# Patient Record
Sex: Female | Born: 1950
Health system: Southern US, Community
[De-identification: ages and names within clinical notes are randomized; demographics above are authoritative.]

## PROBLEM LIST (undated history)

## (undated) DIAGNOSIS — Z98811 Dental restoration status: Secondary | ICD-10-CM

## (undated) DIAGNOSIS — G4733 Obstructive sleep apnea (adult) (pediatric): Secondary | ICD-10-CM

## (undated) DIAGNOSIS — S92001A Unspecified fracture of right calcaneus, initial encounter for closed fracture: Secondary | ICD-10-CM

## (undated) DIAGNOSIS — E119 Type 2 diabetes mellitus without complications: Secondary | ICD-10-CM

## (undated) DIAGNOSIS — M797 Fibromyalgia: Secondary | ICD-10-CM

## (undated) DIAGNOSIS — E785 Hyperlipidemia, unspecified: Secondary | ICD-10-CM

## (undated) DIAGNOSIS — F32A Depression, unspecified: Secondary | ICD-10-CM

## (undated) DIAGNOSIS — Z972 Presence of dental prosthetic device (complete) (partial): Secondary | ICD-10-CM

## (undated) DIAGNOSIS — R112 Nausea with vomiting, unspecified: Secondary | ICD-10-CM

## (undated) DIAGNOSIS — Z9889 Other specified postprocedural states: Secondary | ICD-10-CM

## (undated) DIAGNOSIS — I10 Essential (primary) hypertension: Secondary | ICD-10-CM

## (undated) DIAGNOSIS — S90829A Blister (nonthermal), unspecified foot, initial encounter: Secondary | ICD-10-CM

## (undated) DIAGNOSIS — R12 Heartburn: Secondary | ICD-10-CM

## (undated) DIAGNOSIS — Z8719 Personal history of other diseases of the digestive system: Secondary | ICD-10-CM

## (undated) DIAGNOSIS — F329 Major depressive disorder, single episode, unspecified: Secondary | ICD-10-CM

## (undated) DIAGNOSIS — G8929 Other chronic pain: Secondary | ICD-10-CM

## (undated) DIAGNOSIS — M549 Dorsalgia, unspecified: Secondary | ICD-10-CM

## (undated) HISTORY — DX: Hyperlipidemia, unspecified: E78.5

## (undated) HISTORY — PX: ABDOMINAL HYSTERECTOMY: SHX81

## (undated) HISTORY — DX: Major depressive disorder, single episode, unspecified: F32.9

## (undated) HISTORY — DX: Depression, unspecified: F32.A

## (undated) HISTORY — PX: TRACHEOSTOMY: SUR1362

## (undated) HISTORY — DX: Obstructive sleep apnea (adult) (pediatric): G47.33

## (undated) HISTORY — PX: FOOT SURGERY: SHX648

## (undated) HISTORY — PX: TYMPANOPLASTY: SHX33

## (undated) HISTORY — PX: APPENDECTOMY: SHX54

## (undated) HISTORY — DX: Essential (primary) hypertension: I10

## (undated) HISTORY — PX: BILATERAL OOPHORECTOMY: SHX1221

## (undated) HISTORY — DX: Type 2 diabetes mellitus without complications: E11.9

---

## 1998-02-11 ENCOUNTER — Ambulatory Visit (HOSPITAL_COMMUNITY): Admission: RE | Admit: 1998-02-11 | Discharge: 1998-02-11 | Payer: Self-pay | Admitting: Neurology

## 1998-07-14 ENCOUNTER — Encounter: Payer: Self-pay | Admitting: Neurosurgery

## 1998-07-18 ENCOUNTER — Inpatient Hospital Stay (HOSPITAL_COMMUNITY): Admission: RE | Admit: 1998-07-18 | Discharge: 1998-07-20 | Payer: Self-pay | Admitting: Neurosurgery

## 1998-07-18 ENCOUNTER — Encounter: Payer: Self-pay | Admitting: Neurosurgery

## 1998-07-18 HISTORY — PX: ANTERIOR CERVICAL DISCECTOMY: SHX1160

## 1999-04-13 ENCOUNTER — Encounter: Admission: RE | Admit: 1999-04-13 | Discharge: 1999-04-13 | Payer: Self-pay | Admitting: Neurosurgery

## 2001-04-21 ENCOUNTER — Encounter: Admission: RE | Admit: 2001-04-21 | Discharge: 2001-04-21 | Payer: Self-pay | Admitting: Neurosurgery

## 2001-04-21 ENCOUNTER — Encounter: Payer: Self-pay | Admitting: Neurosurgery

## 2004-03-15 ENCOUNTER — Encounter: Admission: RE | Admit: 2004-03-15 | Discharge: 2004-03-15 | Payer: Self-pay | Admitting: Neurosurgery

## 2004-03-27 ENCOUNTER — Encounter: Admission: RE | Admit: 2004-03-27 | Discharge: 2004-03-27 | Payer: Self-pay | Admitting: Neurosurgery

## 2004-06-05 ENCOUNTER — Encounter: Admission: RE | Admit: 2004-06-05 | Discharge: 2004-06-05 | Payer: Self-pay | Admitting: Neurosurgery

## 2004-06-22 ENCOUNTER — Encounter: Admission: RE | Admit: 2004-06-22 | Discharge: 2004-06-22 | Payer: Self-pay | Admitting: Neurosurgery

## 2004-07-06 ENCOUNTER — Encounter: Admission: RE | Admit: 2004-07-06 | Discharge: 2004-07-06 | Payer: Self-pay | Admitting: Neurosurgery

## 2004-08-01 ENCOUNTER — Encounter: Admission: RE | Admit: 2004-08-01 | Discharge: 2004-08-01 | Payer: Self-pay | Admitting: Neurosurgery

## 2004-10-04 ENCOUNTER — Inpatient Hospital Stay (HOSPITAL_COMMUNITY): Admission: RE | Admit: 2004-10-04 | Discharge: 2004-10-06 | Payer: Self-pay | Admitting: Neurosurgery

## 2004-10-04 HISTORY — PX: POSTERIOR LUMBAR FUSION: SHX6036

## 2005-01-24 ENCOUNTER — Emergency Department (HOSPITAL_COMMUNITY): Admission: EM | Admit: 2005-01-24 | Discharge: 2005-01-24 | Payer: Self-pay | Admitting: Emergency Medicine

## 2005-01-26 ENCOUNTER — Encounter (HOSPITAL_COMMUNITY): Admission: RE | Admit: 2005-01-26 | Discharge: 2005-02-25 | Payer: Self-pay | Admitting: Emergency Medicine

## 2005-01-27 ENCOUNTER — Ambulatory Visit (HOSPITAL_COMMUNITY): Payer: Self-pay | Admitting: Emergency Medicine

## 2006-07-29 ENCOUNTER — Encounter: Admission: RE | Admit: 2006-07-29 | Discharge: 2006-07-29 | Payer: Self-pay | Admitting: Internal Medicine

## 2007-07-30 ENCOUNTER — Encounter: Admission: RE | Admit: 2007-07-30 | Discharge: 2007-07-30 | Payer: Self-pay | Admitting: Internal Medicine

## 2007-11-07 ENCOUNTER — Ambulatory Visit (HOSPITAL_COMMUNITY): Admission: RE | Admit: 2007-11-07 | Discharge: 2007-11-07 | Payer: Self-pay | Admitting: Family Medicine

## 2008-04-19 ENCOUNTER — Ambulatory Visit (HOSPITAL_COMMUNITY): Admission: RE | Admit: 2008-04-19 | Discharge: 2008-04-19 | Payer: Self-pay | Admitting: Urology

## 2009-10-03 ENCOUNTER — Encounter: Payer: Self-pay | Admitting: Cardiology

## 2009-10-06 ENCOUNTER — Encounter: Payer: Self-pay | Admitting: Cardiology

## 2010-02-23 ENCOUNTER — Encounter (INDEPENDENT_AMBULATORY_CARE_PROVIDER_SITE_OTHER): Payer: Self-pay | Admitting: *Deleted

## 2010-02-23 ENCOUNTER — Observation Stay (HOSPITAL_COMMUNITY): Admission: EM | Admit: 2010-02-23 | Discharge: 2010-02-24 | Payer: Self-pay | Admitting: Emergency Medicine

## 2010-02-23 ENCOUNTER — Ambulatory Visit: Payer: Self-pay | Admitting: Cardiology

## 2010-02-24 ENCOUNTER — Encounter (INDEPENDENT_AMBULATORY_CARE_PROVIDER_SITE_OTHER): Payer: Self-pay | Admitting: Internal Medicine

## 2010-02-24 ENCOUNTER — Encounter (INDEPENDENT_AMBULATORY_CARE_PROVIDER_SITE_OTHER): Payer: Self-pay | Admitting: *Deleted

## 2010-03-10 ENCOUNTER — Encounter: Payer: Self-pay | Admitting: Cardiology

## 2010-04-03 ENCOUNTER — Encounter: Payer: Self-pay | Admitting: Cardiology

## 2010-04-03 DIAGNOSIS — E785 Hyperlipidemia, unspecified: Secondary | ICD-10-CM

## 2010-04-03 DIAGNOSIS — I10 Essential (primary) hypertension: Secondary | ICD-10-CM | POA: Insufficient documentation

## 2010-04-03 DIAGNOSIS — I498 Other specified cardiac arrhythmias: Secondary | ICD-10-CM | POA: Insufficient documentation

## 2010-04-04 ENCOUNTER — Ambulatory Visit: Payer: Self-pay | Admitting: Cardiology

## 2010-04-04 ENCOUNTER — Encounter (INDEPENDENT_AMBULATORY_CARE_PROVIDER_SITE_OTHER): Payer: Self-pay | Admitting: *Deleted

## 2010-04-04 DIAGNOSIS — R079 Chest pain, unspecified: Secondary | ICD-10-CM

## 2010-04-04 DIAGNOSIS — R0602 Shortness of breath: Secondary | ICD-10-CM

## 2010-04-25 ENCOUNTER — Ambulatory Visit: Payer: Self-pay | Admitting: Cardiology

## 2010-04-25 ENCOUNTER — Encounter: Payer: Self-pay | Admitting: Cardiology

## 2010-04-30 ENCOUNTER — Encounter: Payer: Self-pay | Admitting: Cardiology

## 2010-05-01 ENCOUNTER — Ambulatory Visit: Payer: Self-pay | Admitting: Cardiology

## 2010-07-22 ENCOUNTER — Encounter: Payer: Self-pay | Admitting: Family Medicine

## 2010-07-23 ENCOUNTER — Encounter: Payer: Self-pay | Admitting: Family Medicine

## 2010-07-23 ENCOUNTER — Encounter: Payer: Self-pay | Admitting: Neurosurgery

## 2010-08-01 NOTE — Letter (Signed)
Summary: [facility] D/C DR. Crista Curb  [place] D/C DR. Crista Curb   Imported By: Zachary George 04/03/2010 09:23:08  _____________________________________________________________________  External Attachment:    Type:   Image     Comment:   External Document

## 2010-08-01 NOTE — Miscellaneous (Signed)
  Clinical Lists Changes  Observations: Added new observation of PAST MED HX: Hypertension Depression Dyslipidemia Bradycardia....assymptomatic...hospital..Marland Kitchen8/2011 GI bleed in the past EF  60-65%...echo...02/24/2010 Chest pain and SOB   04/04/2010    nuclear, 04/25/2010..normal...EF 70% (04/30/2010 15:56) Added new observation of PRIMARY MD: Hasanaj (04/30/2010 15:56)       Past History:  Past Medical History: Hypertension Depression Dyslipidemia Bradycardia....assymptomatic...hospital..Marland Kitchen8/2011 GI bleed in the past EF  60-65%...echo...02/24/2010 Chest pain and SOB   04/04/2010    nuclear, 04/25/2010..normal...EF 70%

## 2010-08-01 NOTE — Assessment & Plan Note (Signed)
Summary: 1 MO F/U PER 10/4 OV-JM   Visit Type:  Follow-up Primary Cassandra Mccullough:  Hasanaj  CC:  chest pain.  History of Present Illness: patient is seen for cardiology followup.  I saw her last April 04, 2010.  She had been hospitalized with chest pain and had no MI.  She was discharged home and I saw her in the office.  Decision was made to proceed with stress Myoview.  This was done with pharmacologic stress.  Study was normal.  Ejection fraction was normal and there was no ischemia.  She is now seen for followup..  Preventive Screening-Counseling & Management  Alcohol-Tobacco     Smoking Status: quit     Year Quit: 2002  Current Medications (verified): 1)  Hyzaar 100-25 Mg Tabs (Losartan Potassium-Hctz) .... Take 1 Tablet By Mouth Once A Day 2)  Fluoxetine Hcl 40 Mg Caps (Fluoxetine Hcl) .... Take 1 Tablet By Mouth Once A Day 3)  Nitrostat 0.4 Mg Subl (Nitroglycerin) .... Use As Directed Chest Pain 4)  Amlodipine Besylate 10 Mg Tabs (Amlodipine Besylate) .... Take 1 Tablet By Mouth Once A Day 5)  Aspir-Low 81 Mg Tbec (Aspirin) .... Take 1 Tablet By Mouth Once A Day 6)  Multivitamins  Tabs (Multiple Vitamin) .... Take 1 Tablet By Mouth Once A Day 7)  Flaxseed Oil 1000 Mg Caps (Flaxseed (Linseed)) .... Take 1 Tablet By Mouth Once A Day 8)  Vitamin B-12 1000 Mcg Tabs (Cyanocobalamin) .... Take 1 Tablet By Mouth Once A Day  Allergies (verified): 1)  ! Nsaids 2)  ! Asa  Comments:  Nurse/Medical Assistant: The patient's medications and allergies were verbally reviewed with the patient and were updated in the Medication and Allergy Lists.  Past History:  Past Medical History: Last updated: 04/30/2010 Hypertension Depression Dyslipidemia Bradycardia....assymptomatic...hospital..Marland Kitchen8/2011 GI bleed in the past EF  60-65%...echo...02/24/2010 Chest pain and SOB   04/04/2010    nuclear, 04/25/2010..normal...EF 70%  Review of Systems       Patient denies fever, chills, headache,  sweats, rash, change in vision, change in hearing, chest pain, cough, nausea vomiting, urinary symptoms.  All the systems are reviewed and are negative.  Vital Signs:  Patient profile:   60 year old female Height:      72 inches Weight:      184 pounds Pulse rate:   66 / minute BP sitting:   119 / 76  (left arm) Cuff size:   regular  Vitals Entered By: Carlye Grippe (May 01, 2010 1:54 PM)  Physical Exam  General:  patient is stable. Eyes:  no xanthelasma. Neck:  no jugular venous distention. Lungs:  lungs are clear.  Respiratory effort is nonlabored. Heart:  cardiac exam reveals S1-S2.  No clicks or significant murmurs Abdomen:  abdomen is soft. Extremities:  no peripheral edema. Psych:  patient is oriented to person time and place.  Affect is normal.   Impression & Recommendations:  Problem # 1:  CHEST PAIN (ICD-786.50)  The following medications were removed from the medication list:    Isosorbide Mononitrate Cr 30 Mg Xr24h-tab (Isosorbide mononitrate) .Marland Kitchen... Take 1 tablet by mouth once a day Her updated medication list for this problem includes:    Nitrostat 0.4 Mg Subl (Nitroglycerin) ..... Use as directed chest pain    Amlodipine Besylate 10 Mg Tabs (Amlodipine besylate) .Marland Kitchen... Take 1 tablet by mouth once a day    Aspir-low 81 Mg Tbec (Aspirin) .Marland Kitchen... Take 1 tablet by mouth once a day There is  no evidence of ischemic disease.  No further workup.  Problem # 2:  SHORTNESS OF BREATH (ICD-786.05)  Her updated medication list for this problem includes:    Hyzaar 100-25 Mg Tabs (Losartan potassium-hctz) .Marland Kitchen... Take 1 tablet by mouth once a day    Amlodipine Besylate 10 Mg Tabs (Amlodipine besylate) .Marland Kitchen... Take 1 tablet by mouth once a day    Aspir-low 81 Mg Tbec (Aspirin) .Marland Kitchen... Take 1 tablet by mouth once a day No further workup of shortness of breath as needed.  Problem # 3:  BRADYCARDIA (ICD-427.89)  The following medications were removed from the medication list:     Isosorbide Mononitrate Cr 30 Mg Xr24h-tab (Isosorbide mononitrate) .Marland Kitchen... Take 1 tablet by mouth once a day Her updated medication list for this problem includes:    Nitrostat 0.4 Mg Subl (Nitroglycerin) ..... Use as directed chest pain    Amlodipine Besylate 10 Mg Tabs (Amlodipine besylate) .Marland Kitchen... Take 1 tablet by mouth once a day    Aspir-low 81 Mg Tbec (Aspirin) .Marland Kitchen... Take 1 tablet by mouth once a day Bradycardias not a significant problem.  No further workup.  Patient should follow up with her primary physician for primary prevention  Patient Instructions: 1)  No further cardiac work up.

## 2010-08-01 NOTE — Assessment & Plan Note (Signed)
Summary: np-chest tightness chest pressure   Visit Type:  Initial Consult Primary Provider:  Hasanaj  CC:  chest pain.  History of Present Illness: The patient is seen in consultation for the evaluation of chest pain and shortness of breath.  She was hospitalized at [facility] on February 23, 2010.  I have reviewed the history physical and the discharge summary.  I have also reviewed the echo report.  She was having right-sided chest pain and was admitted to the hospital.  She also had some asymptomatic bradycardia.  The echo revealed ejection fraction of 65%.  Her blood pressure was elevated.  She ruled out for an MI.  Yesterday she fell and injured her left wrist.  She is having significant pain today.  She did not have syncope.  She will be following up to have her left arm evaluated as soon as she leaves the office today.  She complains of shortness of breath.  She's not having any chest pain today.  Her risk factors include hypertension and dyslipidemia.  She does not smoke. She does have a family history of coronary disease.  Preventive Screening-Counseling & Management  Alcohol-Tobacco     Smoking Status: quit     Year Quit: 2002  Current Medications (verified): 1)  Hyzaar 100-25 Mg Tabs (Losartan Potassium-Hctz) .... Take 1 Tablet By Mouth Once A Day 2)  Fluoxetine Hcl 40 Mg Caps (Fluoxetine Hcl) .... Take 1 Tablet By Mouth Once A Day 3)  Nitrostat 0.4 Mg Subl (Nitroglycerin) .... Use As Directed Chest Pain 4)  Isosorbide Mononitrate Cr 30 Mg Xr24h-Tab (Isosorbide Mononitrate) .... Take 1 Tablet By Mouth Once A Day 5)  Amlodipine Besylate 5 Mg Tabs (Amlodipine Besylate) .... Take 1 Tablet By Mouth Once A Day 6)  Aspir-Low 81 Mg Tbec (Aspirin) .... Take 1 Tablet By Mouth Once A Day  Allergies (verified): 1)  ! Nsaids 2)  ! Asa  Comments:  Nurse/Medical Assistant: The patient's medication bottles and allergies were reviewed with the patient and were updated in the  Medication and Allergy Lists.  Past History:  Past Medical History: Last updated: 04/03/2010 Hypertension Depression Dyslipidemia Bradycardia....assymptomatic...hospital..Marland Kitchen8/2011 GI bleed in the past EF  60-65%...echo...02/24/2010  Family History: There is a family history of coronary disease.  She has 2 brothers who had heart surgery in their 57s.  Social History: the patient does not smoke.Smoking Status:  quit  Review of Systems       Patient denies fever, chills, headache, sweats, rash, change in vision, change in hearing, cough, nausea vomiting, urinary symptoms.  All other systems are reviewed and are negative.  Vital Signs:  Patient profile:   60 year old female Height:      72 inches Weight:      182 pounds BMI:     24.77 Pulse rate:   83 / minute BP sitting:   137 / 81  (right arm) Cuff size:   regular  Vitals Entered By: Carlye Grippe (April 04, 2010 9:13 AM) CC: chest pain   Physical Exam  General:  The patient is uncomfortable with pain in her left wrist.  Head:  head is atraumatic. Eyes:  no xanthelasma. Neck:  no jugular venous distention. Chest Wall:  no chest wall tenderness. Lungs:  lungs are clear.  Respiratory effort is nonlabored. Heart:  cardiac exam reveals S1-S2.  No clicks or significant murmurs. Abdomen:  the abdomen is soft. Msk:  there is swelling in her left wrist. Extremities:  there is  no significant peripheral edema. Skin:  no skin rashes. Psych:  patient is oriented to person time and place.  She is bothered by the pain in her left wrist.   Impression & Recommendations:  Problem # 1:  CHEST PAIN (ICD-786.50)  The following medications were removed from the medication list:    Labetalol Hcl 300 Mg Tabs (Labetalol hcl) .Marland Kitchen... Take 1 tablet by mouth two times a day Her updated medication list for this problem includes:    Nitrostat 0.4 Mg Subl (Nitroglycerin) ..... Use as directed chest pain    Isosorbide Mononitrate Cr 30 Mg  Xr24h-tab (Isosorbide mononitrate) .Marland Kitchen... Take 1 tablet by mouth once a day    Amlodipine Besylate 5 Mg Tabs (Amlodipine besylate) .Marland Kitchen... Take 1 tablet by mouth once a day    Aspir-low 81 Mg Tbec (Aspirin) .Marland Kitchen... Take 1 tablet by mouth once a day Patient had some chest pain and took her into the hospital.  She did not have an MI.  She does have risk factors.  She had normal left ventricular function by echo.  We will plan a Myoview scan and I will see her in followup.  Problem # 2:  SHORTNESS OF BREATH (ICD-786.05)  The following medications were removed from the medication list:    Labetalol Hcl 300 Mg Tabs (Labetalol hcl) .Marland Kitchen... Take 1 tablet by mouth two times a day Her updated medication list for this problem includes:    Hyzaar 100-25 Mg Tabs (Losartan potassium-hctz) .Marland Kitchen... Take 1 tablet by mouth once a day    Amlodipine Besylate 5 Mg Tabs (Amlodipine besylate) .Marland Kitchen... Take 1 tablet by mouth once a day    Aspir-low 81 Mg Tbec (Aspirin) .Marland Kitchen... Take 1 tablet by mouth once a day  Orders: Nuclear Med (Nuc Med) This point the etiology of her shortness of breath is not clear.  I will assess this further after we have the Myoview results area  Problem # 3:  * LEFT WRIST INJURY The patient will seek further medical care after she leaves our office today.  She has a definite injury to her left wrist.  Problem # 4:  BRADYCARDIA (ICD-427.89)  The following medications were removed from the medication list:    Labetalol Hcl 300 Mg Tabs (Labetalol hcl) .Marland Kitchen... Take 1 tablet by mouth two times a day Her updated medication list for this problem includes:    Nitrostat 0.4 Mg Subl (Nitroglycerin) ..... Use as directed chest pain    Isosorbide Mononitrate Cr 30 Mg Xr24h-tab (Isosorbide mononitrate) .Marland Kitchen... Take 1 tablet by mouth once a day    Amlodipine Besylate 5 Mg Tabs (Amlodipine besylate) .Marland Kitchen... Take 1 tablet by mouth once a day    Aspir-low 81 Mg Tbec (Aspirin) .Marland Kitchen... Take 1 tablet by mouth once a day The  patient had bradycardia in the hospital.  It was not symptomatic.  Heart rate today is 83.  No further workup at this time.  Problem # 5:  DYSLIPIDEMIA (ICD-272.4) There is question of dyslipidemia in the past.  She is not on any medications.  She will need followup of her lipid status.  Problem # 6:  * DEPRESSION The patient is on medication for this issue area  Problem # 7:  HYPERTENSION (ICD-401.9)  The following medications were removed from the medication list:    Labetalol Hcl 300 Mg Tabs (Labetalol hcl) .Marland Kitchen... Take 1 tablet by mouth two times a day Her updated medication list for this problem includes:    Hyzaar  100-25 Mg Tabs (Losartan potassium-hctz) .Marland Kitchen... Take 1 tablet by mouth once a day    Amlodipine Besylate 5 Mg Tabs (Amlodipine besylate) .Marland Kitchen... Take 1 tablet by mouth once a day    Aspir-low 81 Mg Tbec (Aspirin) .Marland Kitchen... Take 1 tablet by mouth once a day Blood pressure is treated today.  No change in therapy.  Patient Instructions: 1)  Your physician has requested that you have an Best boy.  For further information please visit [email or web address]  Please follow instruction sheet, as given. 2)  Follow up with Dr. Myrtis Ser on Monday, May 01, 2010 at 2pm. 3)  Your physician recommends that you continue on your current medications as directed. Please refer to the Current Medication list given to you today.

## 2010-08-01 NOTE — Letter (Signed)
Summary: Lexiscan or Dobutamine Pharmacist, community at [facility]  [address]   [place], [place] [address]   Phone: [phone]  Fax: [phone]      [facility]  Lexiscan or Dobutamine Cardiolite Strss Test    Cassandra Mccullough  Appointment Date:_  Appointment Time:_  Your doctor has ordered a CARDIOLITE STRESS TEST using a medication to stimulate exercise so that you will not have to walk on the treadmill to determine the condition of your heart during stress. If you take blood pressure medication, ask your doctor if you should take it the day of your test. You should not have anything to eat or drink at least 4 hours before your test is scheduled, and no caffeine, including decaffeinated tea and coffee, chocolate, and soft drinks for 24 hours before your test.  You will need to register at the Outpatient/Main Entrance at the hospital 15 minutes before your appointment time. It is a good idea to bring a copy of your order with you. They will direct you to the Diagnostic Imaging (Radiology) Department.  You will be asked to undress from the waist up and given a hospital gown to wear, so dress comfortably from the waist down for example: Sweat pants, shorts, or skirt Rubber soled lace up shoes (tennis shoes)  Plan on about three hours from registration to release from the hospital  You may take all of your medications with a sip of water on the morning of your test.

## 2010-08-01 NOTE — Letter (Signed)
Summary: External Correspondence/ OFFICE VISIT DR. HASANAJ  External Correspondence/ OFFICE VISIT DR. HASANAJ   Imported By: Dorise Hiss 04/03/2010 15:38:26  _____________________________________________________________________  External Attachment:    Type:   Image     Comment:   External Document

## 2010-08-01 NOTE — Miscellaneous (Signed)
  Clinical Lists Changes  Problems: Added new problem of HYPERTENSION (ICD-401.9) Added new problem of * DEPRESSION Added new problem of DYSLIPIDEMIA (ICD-272.4) Added new problem of BRADYCARDIA (ICD-427.89) Observations: Added new observation of PAST MED HX: Hypertension Depression Dyslipidemia Bradycardia....assymptomatic...hospital..Marland Kitchen8/2011 GI bleed in the past EF  60-65%...echo...02/24/2010 (04/03/2010 10:01)       Past History:  Past Medical History: Hypertension Depression Dyslipidemia Bradycardia....assymptomatic...hospital..Marland Kitchen8/2011 GI bleed in the past EF  60-65%...echo...02/24/2010

## 2010-09-15 LAB — POCT CARDIAC MARKERS
CKMB, poc: 1.1 ng/mL (ref 1.0–8.0)
Myoglobin, poc: 42.9 ng/mL (ref 12–200)
Myoglobin, poc: 57.1 ng/mL (ref 12–200)
Troponin i, poc: <0.05 ng/mL (ref 0.00–0.09)

## 2010-09-15 LAB — COMPREHENSIVE METABOLIC PANEL
Albumin: 3.6 g/dL (ref 3.5–5.2)
BUN: 13 mg/dL (ref 6–23)
CO2: 25 mEq/L (ref 19–32)
Chloride: 109 mEq/L (ref 96–112)
Creatinine, Ser: 0.68 mg/dL (ref 0.4–1.2)
GFR calc non Af Amer: >60 mL/min (ref >60)
Glucose, Bld: 109 mg/dL — ABNORMAL HIGH (ref 70–99)
Total Bilirubin: 0.5 mg/dL (ref 0.3–1.2)

## 2010-09-15 LAB — DIFFERENTIAL
Basophils Absolute: 0.1 10*3/uL (ref 0.0–0.1)
Basophils Absolute: 0.1 10*3/uL (ref 0.0–0.1)
Basophils Relative: 1 % (ref 0–1)
Basophils Relative: 1 % (ref 0–1)
Eosinophils Absolute: 0.2 10*3/uL (ref 0.0–0.7)
Eosinophils Relative: 3 % (ref 0–5)
Lymphocytes Relative: 37 % (ref 12–46)
Lymphocytes Relative: 40 % (ref 12–46)
Lymphs Abs: 3.4 10*3/uL (ref 0.7–4.0)
Monocytes Absolute: 0.6 10*3/uL (ref 0.1–1.0)
Monocytes Relative: 7 % (ref 3–12)
Neutro Abs: 4 10*3/uL (ref 1.7–7.7)
Neutro Abs: 4.2 10*3/uL (ref 1.7–7.7)
Neutrophils Relative %: 49 % (ref 43–77)
Neutrophils Relative %: 50 % (ref 43–77)

## 2010-09-15 LAB — CBC
HCT: 36.5 % (ref 36.0–46.0)
HCT: 36.7 % (ref 36.0–46.0)
Hemoglobin: 12.6 g/dL (ref 12.0–15.0)
MCH: 30.9 pg (ref 26.0–34.0)
MCH: 31.2 pg (ref 26.0–34.0)
MCHC: 34.5 g/dL (ref 30.0–36.0)
MCV: 89.6 fL (ref 78.0–100.0)
MCV: 90.7 fL (ref 78.0–100.0)
Platelets: 189 10*3/uL (ref 150–400)
Platelets: 204 10*3/uL (ref 150–400)
RBC: 4.03 MIL/uL (ref 3.87–5.11)
RBC: 4.1 MIL/uL (ref 3.87–5.11)
RDW: 12.7 % (ref 11.5–15.5)
WBC: 8.5 10*3/uL (ref 4.0–10.5)

## 2010-09-15 LAB — BASIC METABOLIC PANEL
BUN: 15 mg/dL (ref 6–23)
CO2: 24 mEq/L (ref 19–32)
Calcium: 9 mg/dL (ref 8.4–10.5)
Chloride: 106 mEq/L (ref 96–112)
Creatinine, Ser: 0.8 mg/dL (ref 0.4–1.2)
GFR calc Af Amer: >60 mL/min (ref >60)
GFR calc non Af Amer: >60 mL/min (ref >60)
Glucose, Bld: 108 mg/dL — ABNORMAL HIGH (ref 70–99)
Potassium: 3.8 mEq/L (ref 3.5–5.1)
Sodium: 137 mEq/L (ref 135–145)

## 2010-09-15 LAB — LIPID PANEL
HDL: 27 mg/dL — ABNORMAL LOW (ref >39)
LDL Cholesterol: UNDETERMINED mg/dL (ref 0–99)
Triglycerides: 543 mg/dL — ABNORMAL HIGH (ref <150)
VLDL: UNDETERMINED mg/dL (ref 0–40)

## 2010-09-15 LAB — RAPID URINE DRUG SCREEN, HOSP PERFORMED
Barbiturates: NOT DETECTED
Benzodiazepines: NOT DETECTED

## 2010-09-15 LAB — CARDIAC PANEL(CRET KIN+CKTOT+MB+TROPI): Troponin I: 0.03 ng/mL (ref 0.00–0.06)

## 2010-09-15 LAB — TSH: TSH: 0.132 u[IU]/mL — ABNORMAL LOW (ref 0.350–4.500)

## 2010-09-15 LAB — D-DIMER, QUANTITATIVE: D-Dimer, Quant: 0.22 ug/mL-FEU (ref 0.00–0.48)

## 2010-09-15 LAB — PHOSPHORUS: Phosphorus: 3.3 mg/dL (ref 2.3–4.6)

## 2010-09-15 LAB — MAGNESIUM: Magnesium: 2.1 mg/dL (ref 1.5–2.5)
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
## 2010-11-17 NOTE — Discharge Summary (Signed)
NAMEYARIMAR, LAVIS            ACCOUNT NO.:  [id number]   MEDICAL RECORD NO.:  [id number]          PATIENT TYPE:  INP   LOCATION:  3020                         FACILITY:  [facility]   PHYSICIAN:  Coletta Memos, M.D.     DATE OF BIRTH:  [birth year]   DATE OF ADMISSION:  10/04/2004  DATE OF DISCHARGE:  10/06/2004                                 DISCHARGE SUMMARY   PREOPERATIVE DIAGNOSES:  1.  Degenerative disk disease, L5-S1.  2.  Low back pain.   DISCHARGE DIAGNOSES:  1.  Degenerative disk disease, L5-S1.  2.  Low back pain.   PROCEDURE:  1.  Posterior lumbar interbody fusion with Synthes 9 mm cages packed with      morselized allograft and autograph.  2.  Spire spinous process plate.   COMPLICATIONS:  None.   DISCHARGE STATUS:  Alive and well.   WOUND:  Clean, dry, without signs of infection.   MEDICATIONS:  1.  Vicodin 1 tablet q.6h. 5 mg.  2.  Flexeril 10 mg p.o. t.i.d. p.r.n. muscle spasms.   The patient is able to void and ambulate without difficulty.   Mrs. Lafever is a long-time patient of mine whom I have followed for a  number of years now with degenerative disk disease of the lumbar spine.  I  felt she would have been a good candidate for disc arthroplasty.  However,  it did not appear that at any time insurance agencies would be covering this  procedure.  I, therefore, proceeded with a lumbar fusion, using a spinous  process plate to obviate the need for pedicle screws and PLIF.  She  underwent this procedure on October 04, 2004, without complication.  Postoperatively, she has done well.  Full strength in the lower extremities.  Wound is clean, dry, without signs of infection.  She will be discharged  home and given instructions per my instruction sheet.  No lifting, bending  or twisting.  No driving for 12 days in her case.  She knows to call the  office for a return appointment.     KC/MEDQ  D:  10/06/2004  T:  10/07/2004  Job:  [id number]

## 2010-11-17 NOTE — Op Note (Signed)
NAMEANYELI, H[place]EN[place]            ACCOUNT NO.:  [id number]   MEDICAL RECORD NO.:  [id number]          PATIENT TYPE:  INP   LOCATION:  3020                         FACILITY:  [facility]   PHYSICIAN:  Coletta Memos, M.D.     DATE OF BIRTH:  [birth year]   DATE OF PROCEDURE:  10/04/2004  DATE OF DISCHARGE:                                 OPERATIVE REPORT   PREOPERATIVE DIAGNOSES:  1.  Degenerated disk L5-S1.  2.  Low back pain.  3.  Spondylosis without myelopathy.   POSTOPERATIVE DIAGNOSES:  1.  Degenerated disk L5-S1.  2.  Low back pain.  3.  Spondylosis without myelopathy.   PROCEDURE:  1.  Posterior lumbar antibody fusion with two 9 mm Synthes P cages packed      with morselized allograft chromous which was had been mixed with the      patient's own venous blood.  2.  Posterolateral arthrodesis.  3.  Posterior instrumentation using Aspire spinous process plate; small      plate was used.   OPERATIVE SURGEON:  Dr. Franky Macho   ASSISTANT:  Dr. Newell Coral.   OPERATIVE INDICATIONS:  Laketra Bowdish is a woman whom I have followed for  quite some time for low back pain.  She has a degenerated disk at L5-S1.  After reading recent studies showing no benefit to disk gram, I told her  that arthroplasty still had not been approved by insurance companies.  Her  pain was such that it was getting worse with each passing day.  I therefore  recommended and she agreed to undergo an operative decompression and fusion  via PLIF and use of a spinous process plate.  The patient understood that  this was an off-label use of the plate, but that I felt it was more than  safe as I have done these operations with the PLIF cages only.   OPERATIVE NOTE:  Mrs. Pifer was taken to the operating room, intubated,  and placed under general anesthetic without difficulty.  She was rolled  prone, with use of body rolls, and all pressure points were properly padded.  A Foley catheter had been placed under sterile  conditions.  Her back was  prepped, and she was draped in a sterile fashion.  I infiltrated 20 mL 0.5%  lidocaine 1:200,000 epinephrine into the lumbar region.  I opened the skin,  took this down to the thoracolumbar fascia sharply.  I then exposed the  lamina of L5 and of S1.  I confirmed this location using x-ray.  I then  proceeded with diskectomies, first on the left side, then on the right side  at L5-S1.  I had to remove significant amount of osteophytes posteriorly on  the L5 and S1 vertebral bodies.  But I was able to do this and using the  shavers from the Synthes PLIF set, 7 mm shaver specifically, I was removed a  significant amount of disk material and was able to smooth out the edges.  After preparing the interspace for the cages, I then had two 9 mm cages  packed with morselized auto and allograft.  They were placed into the disk  space after placing morselized allograft into the disk space.  This done  without difficulty.  No retraction whatsoever needed to be placed on the L5  roots.  I then, with Dr. Earl Gala assistance, placed the spinous process  plate, small, from L5 to S1, and that was locked into position.  X-ray  showed the plate and the PLIF cages to be in good position.  I then  irrigated the wound.  I then closed the wound in layered fashion using  Vicryl sutures.  Dermabond was used for a sterile dressing.      KC/MEDQ  D:  10/04/2004  T:  10/04/2004  Job:  [id number]

## 2012-01-28 ENCOUNTER — Encounter: Payer: Self-pay | Admitting: Cardiology

## 2012-04-02 DIAGNOSIS — R079 Chest pain, unspecified: Secondary | ICD-10-CM

## 2012-04-24 ENCOUNTER — Encounter: Payer: Self-pay | Admitting: Cardiology

## 2012-04-29 DIAGNOSIS — S92001A Unspecified fracture of right calcaneus, initial encounter for closed fracture: Secondary | ICD-10-CM

## 2012-04-29 HISTORY — DX: Unspecified fracture of right calcaneus, initial encounter for closed fracture: S92.001A

## 2012-05-02 DIAGNOSIS — S90829A Blister (nonthermal), unspecified foot, initial encounter: Secondary | ICD-10-CM

## 2012-05-02 HISTORY — DX: Blister (nonthermal), unspecified foot, initial encounter: S90.829A

## 2012-05-12 ENCOUNTER — Encounter (HOSPITAL_BASED_OUTPATIENT_CLINIC_OR_DEPARTMENT_OTHER)
Admission: RE | Admit: 2012-05-12 | Discharge: 2012-05-12 | Disposition: A | Discharge status: Home / Self Care | Payer: PRIVATE HEALTH INSURANCE | Source: Ambulatory Visit | Attending: Orthopedic Surgery | Admitting: Orthopedic Surgery

## 2012-05-12 ENCOUNTER — Encounter (HOSPITAL_BASED_OUTPATIENT_CLINIC_OR_DEPARTMENT_OTHER): Payer: Self-pay | Admitting: *Deleted

## 2012-05-12 ENCOUNTER — Other Ambulatory Visit: Payer: Self-pay | Admitting: Orthopedic Surgery

## 2012-05-12 LAB — BASIC METABOLIC PANEL
CO2: 26 mEq/L (ref 19–32)
Chloride: 101 mEq/L (ref 96–112)
GFR calc non Af Amer: >90 mL/min (ref >90)
Glucose, Bld: 103 mg/dL — ABNORMAL HIGH (ref 70–99)
Potassium: 3.6 mEq/L (ref 3.5–5.1)
Sodium: 140 mEq/L (ref 135–145)

## 2012-05-12 NOTE — H&P (Signed)
Cassandra Mccullough is an 61 y.o. female.   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  She is done well since last visit, with decreasing pain and swelling.  Her initial history follows:  She was about 7 feet up on a ladder when she slipped and fell.  She was evaluated at the emergency department even and placed into a bulky splint.  My partner, Dr. Turner Daniels, was contacted yesterday while on call for [facility]  I have agreed to assume care for this patient.  She has experienced a reasonable degree of pain, taking one Percocet every 4-6 hours.  She is not taking any anti-inflammatories.  She did take her splint off at home and just reapplied today when coming here.  It was removed secondary to pain.  Her son is with her today.  She has had some type of forefoot surgery in the past by her report for metatarsal pain, and in fact contemplated some additional surgery recently in [place].  Past Medical History  Diagnosis Date  . Depression   . Dyslipidemia   . PONV (postoperative nausea and vomiting)   . Heartburn     occasional - OTC as needed  . History of GI bleed   . Hypertension     under control, has been on med. x 5-6 yrs.  . Wears partial dentures     upper and lower  . Dental crowns present   . Blister of heel 05/2012    fracture blisters right heel  . Calcaneus fracture, right 04/29/2012    Past Surgical History  Procedure Date  . Posterior lumbar fusion 10/04/2004  . Appendectomy   . Foot surgery     right  . Anterior cervical discectomy 07/18/1998    C5-6, C6-7 with arthrodesis  . Abdominal hysterectomy     partial  . Tracheostomy   . Tympanoplasty   . Bilateral oophorectomy     with bilat. salpingectomy    Family History  Problem Relation Age of Onset  . Prostate cancer Father   . Colon cancer Mother    Social History:   reports that she has quit smoking. She has never used smokeless tobacco. She reports that she does not drink alcohol or use illicit drugs.  Allergies:  Allergies  Allergen Reactions  . Aspirin Nausea And Vomiting  . Nsaids Other (See Comments)    GI BLEED    No prescriptions prior to admission    No results found for this or any previous visit (from the past 48 hour(s)). No results found.  Review of Systems  All other systems reviewed and are negative.    Height 6' (1.829 m), weight 78.019 kg (172 lb). Physical Exam  Vitals reviewed.   Constitutional:  WD, WN, NAD HEENT:  NCAT, EOMI Neuro/Psych:  Alert & oriented to person, place, and time; appropriate mood & affect Lymphatic: No generalized UE edema or lymphadenopathy Extremities / MSK:  Both LE are normal with respect to appearance, ranges of motion, joint stability, muscle strength/tone, sensation, & perfusion except as otherwise noted:  The right foot is less swollen with extensive fracture blistering on the medial aspect from above the ankle to near the plantar skin.  This is debrided, to reveal healthy underlying intact skin.  One small blister was present on the lateral side.  She has intact light touch sensation on the plantar  and dorsal aspects of the toes and can wiggle her toes up and down without significant increased pain.  The toes are warm with brisk capillary refill.  Assessment/Plan Assessment: Right closed calcaneus fracture  Plan:  I discussed this injury with the patient.  I stressed the need for continued strict elevation to prevent complications associated with swelling.  I recommended operative treatment for correction of the intra-articular malalignment as well as the extreme shortening of the calcaneus.    The details of the operative procedure were discussed with the patient.  Questions were invited and answered.  In addition to the goal of the procedure, the risks of the procedure to include but not  limited to bleeding; infection; damage to the nerves or blood vessels that could result in bleeding, numbness, weakness, chronic pain, and the need for additional procedures; stiffness; the need for revision surgery; and anesthetic risks, the worst of which is death, were reviewed.  No specific outcome was guaranteed or implied.  Informed consent was obtained, and the document executed.  Prescriptions for postoperative analgesia were also written.  A return appointment 10-15 days postop was made.  Grae Cannata A. 05/12/2012, 5:10 PM

## 2012-05-12 NOTE — Pre-Procedure Instructions (Signed)
Stress test and echo req. from [facility]; no EKG at [facility]

## 2012-05-14 ENCOUNTER — Encounter (HOSPITAL_BASED_OUTPATIENT_CLINIC_OR_DEPARTMENT_OTHER): Payer: Self-pay | Admitting: Anesthesiology

## 2012-05-14 ENCOUNTER — Encounter (HOSPITAL_BASED_OUTPATIENT_CLINIC_OR_DEPARTMENT_OTHER): Payer: Self-pay | Admitting: *Deleted

## 2012-05-14 ENCOUNTER — Ambulatory Visit (HOSPITAL_BASED_OUTPATIENT_CLINIC_OR_DEPARTMENT_OTHER)
Admission: RE | Admit: 2012-05-14 | Discharge: 2012-05-14 | Disposition: A | Discharge status: Home / Self Care | Payer: PRIVATE HEALTH INSURANCE | Source: Ambulatory Visit | Attending: Orthopedic Surgery | Admitting: Orthopedic Surgery

## 2012-05-14 ENCOUNTER — Ambulatory Visit (HOSPITAL_COMMUNITY): Payer: PRIVATE HEALTH INSURANCE

## 2012-05-14 ENCOUNTER — Ambulatory Visit (HOSPITAL_BASED_OUTPATIENT_CLINIC_OR_DEPARTMENT_OTHER): Payer: PRIVATE HEALTH INSURANCE | Admitting: Anesthesiology

## 2012-05-14 ENCOUNTER — Encounter (HOSPITAL_BASED_OUTPATIENT_CLINIC_OR_DEPARTMENT_OTHER)
Admission: RE | Disposition: A | Discharge status: Home / Self Care | Payer: Self-pay | Source: Ambulatory Visit | Attending: Orthopedic Surgery

## 2012-05-14 ENCOUNTER — Other Ambulatory Visit: Payer: Self-pay

## 2012-05-14 DIAGNOSIS — E785 Hyperlipidemia, unspecified: Secondary | ICD-10-CM | POA: Insufficient documentation

## 2012-05-14 DIAGNOSIS — I10 Essential (primary) hypertension: Secondary | ICD-10-CM | POA: Insufficient documentation

## 2012-05-14 DIAGNOSIS — S92009A Unspecified fracture of unspecified calcaneus, initial encounter for closed fracture: Secondary | ICD-10-CM | POA: Insufficient documentation

## 2012-05-14 DIAGNOSIS — W11XXXA Fall on and from ladder, initial encounter: Secondary | ICD-10-CM | POA: Insufficient documentation

## 2012-05-14 DIAGNOSIS — Z01812 Encounter for preprocedural laboratory examination: Secondary | ICD-10-CM | POA: Insufficient documentation

## 2012-05-14 HISTORY — DX: Personal history of other diseases of the digestive system: Z87.19

## 2012-05-14 HISTORY — PX: ORIF CALCANEOUS FRACTURE: SHX5030

## 2012-05-14 HISTORY — DX: Unspecified fracture of right calcaneus, initial encounter for closed fracture: S92.001A

## 2012-05-14 HISTORY — DX: Presence of dental prosthetic device (complete) (partial): Z97.2

## 2012-05-14 HISTORY — DX: Heartburn: R12

## 2012-05-14 HISTORY — DX: Dental restoration status: Z98.811

## 2012-05-14 HISTORY — DX: Other specified postprocedural states: Z98.890

## 2012-05-14 HISTORY — DX: Nausea with vomiting, unspecified: R11.2

## 2012-05-14 HISTORY — DX: Blister (nonthermal), unspecified foot, initial encounter: S90.829A

## 2012-05-14 SURGERY — OPEN REDUCTION INTERNAL FIXATION (ORIF) CALCANEOUS FRACTURE
Anesthesia: General | Site: Foot | Laterality: Right | Wound class: Clean

## 2012-05-14 MED ORDER — HYDROMORPHONE HCL PF 1 MG/ML IJ SOLN: 0.2500 mg | INTRAMUSCULAR | Status: DC | PRN

## 2012-05-14 MED ORDER — OXYCODONE HCL 5 MG PO TABS: 5.0000 mg | ORAL_TABLET | Freq: Once | ORAL | Status: DC | PRN

## 2012-05-14 MED ORDER — LIDOCAINE HCL (CARDIAC) 20 MG/ML IV SOLN
INTRAVENOUS | Status: DC | PRN
  Administered 2012-05-14: 40 mg via INTRAVENOUS

## 2012-05-14 MED ORDER — SCOPOLAMINE 1 MG/3DAYS TD PT72
1.0000 | MEDICATED_PATCH | TRANSDERMAL | Status: DC
  Administered 2012-05-14: 1.5 mg via TRANSDERMAL

## 2012-05-14 MED ORDER — BUPIVACAINE HCL (PF) 0.5 % IJ SOLN
INTRAMUSCULAR | Status: DC | PRN
  Administered 2012-05-14: 15 mL

## 2012-05-14 MED ORDER — OXYCODONE-ACETAMINOPHEN 5-325 MG PO TABS: 1.0000 | ORAL_TABLET | ORAL | Status: DC | PRN

## 2012-05-14 MED ORDER — DEXTROSE 5 % IV SOLN
10.0000 mg | INTRAVENOUS | Status: DC | PRN
  Administered 2012-05-14: 50 ug/min via INTRAVENOUS

## 2012-05-14 MED ORDER — DEXAMETHASONE SODIUM PHOSPHATE 10 MG/ML IJ SOLN
INTRAMUSCULAR | Status: DC | PRN
  Administered 2012-05-14: 10 mg via INTRAVENOUS

## 2012-05-14 MED ORDER — HYDROMORPHONE HCL PF 1 MG/ML IJ SOLN: 0.5000 mg | INTRAMUSCULAR | Status: DC | PRN

## 2012-05-14 MED ORDER — CHLORHEXIDINE GLUCONATE 4 % EX LIQD
60.0000 mL | Freq: Once | CUTANEOUS | Status: AC
  Administered 2012-05-14: 4 via TOPICAL

## 2012-05-14 MED ORDER — OXYCODONE HCL 5 MG/5ML PO SOLN: 5.0000 mg | Freq: Once | ORAL | Status: DC | PRN

## 2012-05-14 MED ORDER — LACTATED RINGERS IV SOLN: INTRAVENOUS | Status: DC

## 2012-05-14 MED ORDER — CEFAZOLIN SODIUM-DEXTROSE 2-3 GM-% IV SOLR
2.0000 g | INTRAVENOUS | Status: AC
  Administered 2012-05-14: 2 g via INTRAVENOUS

## 2012-05-14 MED ORDER — BUPIVACAINE-EPINEPHRINE PF 0.5-1:200000 % IJ SOLN
INTRAMUSCULAR | Status: DC | PRN
  Administered 2012-05-14: 30 mL

## 2012-05-14 MED ORDER — EPHEDRINE SULFATE 50 MG/ML IJ SOLN
INTRAMUSCULAR | Status: DC | PRN
  Administered 2012-05-14: 5 mg via INTRAVENOUS
  Administered 2012-05-14: 10 mg via INTRAVENOUS

## 2012-05-14 MED ORDER — PROPOFOL 10 MG/ML IV BOLUS
INTRAVENOUS | Status: DC | PRN
  Administered 2012-05-14: 130 mg via INTRAVENOUS

## 2012-05-14 MED ORDER — ONDANSETRON HCL 4 MG/2ML IJ SOLN
INTRAMUSCULAR | Status: DC | PRN
  Administered 2012-05-14: 4 mg via INTRAVENOUS

## 2012-05-14 MED ORDER — HYDROCODONE-ACETAMINOPHEN 5-325 MG PO TABS: 1.0000 | ORAL_TABLET | ORAL | Status: DC | PRN

## 2012-05-14 MED ORDER — MIDAZOLAM HCL 2 MG/2ML IJ SOLN
0.5000 mg | INTRAMUSCULAR | Status: DC | PRN
  Administered 2012-05-14: 1 mg via INTRAVENOUS

## 2012-05-14 MED ORDER — FENTANYL CITRATE 0.05 MG/ML IJ SOLN
INTRAMUSCULAR | Status: DC | PRN
  Administered 2012-05-14: 25 ug via INTRAVENOUS

## 2012-05-14 MED ORDER — FENTANYL CITRATE 0.05 MG/ML IJ SOLN
50.0000 ug | INTRAMUSCULAR | Status: DC | PRN
  Administered 2012-05-14: 50 ug via INTRAVENOUS

## 2012-05-14 MED ORDER — LACTATED RINGERS IV SOLN
INTRAVENOUS | Status: DC
  Administered 2012-05-14 (×2): via INTRAVENOUS

## 2012-05-14 SURGICAL SUPPLY — 66 items
BIT DRILL 2.5X2.75 QC CALB (BIT) ×2 IMPLANT
BIT DRILL CALIBRATED 2.7 (BIT) ×2 IMPLANT
BLADE SURG 15 STRL LF DISP TIS (BLADE) ×4 IMPLANT
BLADE SURG 15 STRL SS (BLADE) ×4
BNDG COHESIVE 4X5 TAN STRL (GAUZE/BANDAGES/DRESSINGS) ×2 IMPLANT
BNDG COHESIVE 6X5 TAN STRL LF (GAUZE/BANDAGES/DRESSINGS) ×2 IMPLANT
CHLORAPREP W/TINT 26ML (MISCELLANEOUS) ×2 IMPLANT
CLOTH BEACON ORANGE TIMEOUT ST (SAFETY) ×2 IMPLANT
COVER TABLE BACK 60X90 (DRAPES) ×2 IMPLANT
CUFF TOURNIQUET SINGLE 34IN LL (TOURNIQUET CUFF) ×2 IMPLANT
DRAIN TLS ROUND 10FR (DRAIN) IMPLANT
DRAPE C-ARM 42X72 X-RAY (DRAPES) ×2 IMPLANT
DRAPE EXTREMITY T 121X128X90 (DRAPE) ×2 IMPLANT
DRAPE OEC MINIVIEW 54X84 (DRAPES) ×2 IMPLANT
DRAPE SURG 17X23 STRL (DRAPES) ×2 IMPLANT
DRSG EMULSION OIL 3X3 NADH (GAUZE/BANDAGES/DRESSINGS) ×4 IMPLANT
DRSG PAD ABDOMINAL 8X10 ST (GAUZE/BANDAGES/DRESSINGS) ×2 IMPLANT
ELECT REM PT RETURN 9FT ADLT (ELECTROSURGICAL) ×2
ELECTRODE REM PT RTRN 9FT ADLT (ELECTROSURGICAL) ×1 IMPLANT
GLOVE BIO SURGEON STRL SZ 6.5 (GLOVE) ×2 IMPLANT
GLOVE BIO SURGEON STRL SZ7.5 (GLOVE) ×2 IMPLANT
GLOVE BIOGEL PI IND STRL 8 (GLOVE) ×1 IMPLANT
GLOVE BIOGEL PI INDICATOR 8 (GLOVE) ×1
GOWN PREVENTION PLUS XLARGE (GOWN DISPOSABLE) ×4 IMPLANT
K-WIRE ACE 1.6X6 (WIRE) ×8
K-WIRE FIXATION 2.0X6 (WIRE) ×10
KWIRE ACE 1.6X6 (WIRE) ×4 IMPLANT
KWIRE FIXATION 2.0X6 (WIRE) ×5 IMPLANT
NEEDLE HYPO 25X1 1.5 SAFETY (NEEDLE) IMPLANT
NS IRRIG 1000ML POUR BTL (IV SOLUTION) ×2 IMPLANT
PACK BASIN DAY SURGERY FS (CUSTOM PROCEDURE TRAY) ×2 IMPLANT
PAD CAST 4YDX4 CTTN HI CHSV (CAST SUPPLIES) ×1 IMPLANT
PADDING CAST ABS 4INX4YD NS (CAST SUPPLIES) ×2
PADDING CAST ABS COTTON 4X4 ST (CAST SUPPLIES) ×2 IMPLANT
PADDING CAST COTTON 4X4 STRL (CAST SUPPLIES) ×1
PENCIL BUTTON HOLSTER BLD 10FT (ELECTRODE) ×2 IMPLANT
PLATE LOCK SM RT CALC FOOT (Plate) ×2 IMPLANT
SCREW CORT 3.5X40 (Screw) ×4 IMPLANT
SCREW CORTICAL 3.5X46MM (Screw) ×2 IMPLANT
SCREW LOCK CORT STAR 3.5X26 (Screw) ×4 IMPLANT
SCREW LOCK CORT STAR 3.5X28 (Screw) ×4 IMPLANT
SCREW LOCK CORT STAR 3.5X30 (Screw) ×6 IMPLANT
SCREW LOCK CORT STAR 3.5X32 (Screw) ×2 IMPLANT
SCREW LOCK CORT STAR 3.5X40 (Screw) ×2 IMPLANT
SCREW LOCK CORT STAR 3.5X44 (Screw) ×2 IMPLANT
SCREW NL LP 36X3.5 (Screw) ×2 IMPLANT
SHEET MEDIUM DRAPE 40X70 STRL (DRAPES) ×4 IMPLANT
SLEEVE SCD COMPRESS KNEE MED (MISCELLANEOUS) ×2 IMPLANT
SPLINT FAST PLASTER 5X30 (CAST SUPPLIES) ×10
SPLINT PLASTER CAST FAST 5X30 (CAST SUPPLIES) ×10 IMPLANT
SPONGE GAUZE 4X4 12PLY (GAUZE/BANDAGES/DRESSINGS) ×2 IMPLANT
STOCKINETTE 6  STRL (DRAPES) ×1
STOCKINETTE 6 STRL (DRAPES) ×1 IMPLANT
SUCTION FRAZIER TIP 10 FR DISP (SUCTIONS) ×2 IMPLANT
SUT ETHILON 4 0 PS 2 18 (SUTURE) IMPLANT
SUT VIC AB 2-0 PS2 27 (SUTURE) IMPLANT
SUT VIC AB 3-0 PS1 18 (SUTURE)
SUT VIC AB 3-0 PS1 18XBRD (SUTURE) IMPLANT
SYR BULB 3OZ (MISCELLANEOUS) ×2 IMPLANT
SYR CONTROL 10ML LL (SYRINGE) IMPLANT
TOWEL OR 17X24 6PK STRL BLUE (TOWEL DISPOSABLE) ×6 IMPLANT
TOWEL OR NON WOVEN STRL DISP B (DISPOSABLE) ×2 IMPLANT
TUBE CONNECTING 20X1/4 (TUBING) ×4 IMPLANT
UNDERPAD 30X30 INCONTINENT (UNDERPADS AND DIAPERS) ×2 IMPLANT
WATER STERILE IRR 1000ML POUR (IV SOLUTION) ×2 IMPLANT
YANKAUER SUCT BULB TIP NO VENT (SUCTIONS) IMPLANT

## 2012-05-14 NOTE — Progress Notes (Signed)
Pt. with c/o burning to left eye. Dr. Sampson Goon notified- rinsed eye with NS as per Dr. Sampson Goon. Pt. states burning better after rinsed- will continue to monitor for further c/o burning.

## 2012-05-14 NOTE — Progress Notes (Signed)
Assisted Dr. Fitzgerald with right, ultrasound guided, popliteal/saphenous block. Side rails up, monitors on throughout procedure. See vital signs in flow sheet. Tolerated Procedure well. 

## 2012-05-14 NOTE — Anesthesia Postprocedure Evaluation (Signed)
  Anesthesia Post-op Note  Patient: Cassandra Mccullough  Procedure(s) Performed: Procedure(s) (LRB) with comments: OPEN REDUCTION INTERNAL FIXATION (ORIF) CALCANEOUS FRACTURE (Right)  Patient Location: PACU  Anesthesia Type:GA combined with regional for post-op pain  Level of Consciousness: awake, alert  and oriented  Airway and Oxygen Therapy: Patient Spontanous Breathing and Patient connected to face mask oxygen  Post-op Pain: none  Post-op Assessment: Post-op Vital signs reviewed, Patient's Cardiovascular Status Stable, Respiratory Function Stable, Patent Airway and No signs of Nausea or vomiting  Post-op Vital Signs: Reviewed and stable  Complications: No apparent anesthesia complications

## 2012-05-14 NOTE — Anesthesia Procedure Notes (Addendum)
Anesthesia Regional Block:  Popliteal block  Pre-Anesthetic Checklist: ,, timeout performed, Correct Patient, Correct Site, Correct Laterality, Correct Procedure, Correct Position, site marked, Risks and benefits discussed, pre-op evaluation, post-op pain management  Laterality: Right  Prep: Maximum Sterile Barrier Precautions used and chloraprep       Needles:  Injection technique: Single-shot  Needle Type: Echogenic Stimulator Needle          Additional Needles:  Procedures: ultrasound guided (picture in chart) and nerve stimulator Popliteal block  Nerve Stimulator or Paresthesia:  Response: Peroneal, 0.4 mA,  Response: Tibial, 0.4 mA,   Additional Responses:   Narrative:  Start time: 05/14/2012 8:01 AM End time: 05/14/2012 8:15 AM Injection made incrementally with aspirations every 5 mL. Anesthesiologist: Sampson Goon, MD  Additional Notes: 2% Lidocaine skin wheel. Saphenous block with 10cc of 0.5% Bupivicaine plain.  Popliteal block Procedure Name: LMA Insertion Date/Time: 05/14/2012 8:31 AM Performed by: Meyer Russel Pre-anesthesia Checklist: Patient identified, Emergency Drugs available, Suction available and Patient being monitored Patient Re-evaluated:Patient Re-evaluated prior to inductionOxygen Delivery Method: Circle System Utilized Preoxygenation: Pre-oxygenation with 100% oxygen Intubation Type: IV induction Ventilation: Mask ventilation without difficulty LMA: LMA inserted LMA Size: 4.0 Number of attempts: 1 Airway Equipment and Method: bite block Placement Confirmation: positive ETCO2 and breath sounds checked- equal and bilateral Tube secured with: Tape Dental Injury: Teeth and Oropharynx as per pre-operative assessment

## 2012-05-14 NOTE — Transfer of Care (Signed)
Immediate Anesthesia Transfer of Care Note  Patient: Cassandra Mccullough  Procedure(s) Performed: Procedure(s) (LRB) with comments: OPEN REDUCTION INTERNAL FIXATION (ORIF) CALCANEOUS FRACTURE (Right)  Patient Location: PACU  Anesthesia Type:GA combined with regional for post-op pain  Level of Consciousness: awake, alert  and oriented  Airway & Oxygen Therapy: Patient Spontanous Breathing and Patient connected to face mask oxygen  Post-op Assessment: Report given to PACU RN, Post -op Vital signs reviewed and stable and Patient moving all extremities  Post vital signs: Reviewed and stable  Complications: No apparent anesthesia complications

## 2012-05-14 NOTE — Op Note (Signed)
05/14/2012  11:11 AM  PATIENT:  Cassandra Mccullough  61 y.o. female  PRE-OPERATIVE DIAGNOSIS:  Displaced right calcaneus fracture  POST-OPERATIVE DIAGNOSIS:  Same  PROCEDURE:  ORIF displaced right calcaneus fracture using Biomet plate  SURGEON: Cliffton Asters. Janee Morn, MD  PHYSICIAN ASSISTANT: None  ANESTHESIA:  general, with block for postoperative pain control  SPECIMENS:  None  DRAINS:   None  PREOPERATIVE INDICATIONS:  Cassandra Mccullough is a  61 y.o. female with a diagnosis of displaced right calcaneus fracture who failed conservative measures and elected for surgical management.    The risks benefits and alternatives were discussed with the patient preoperatively including but not limited to the risks of infection, bleeding, nerve injury, cardiopulmonary complications, the need for revision surgery, among others, and the patient verbalized understanding and consented to proceed.  OPERATIVE IMPLANTS: Biomet small right calcaneus plate with a mixture locking and nonlocking screws  OPERATIVE FINDINGS: Displaced calcaneus fracture with rotation of the tuberosity, incongruity at the posterior facet, loose chondral surfaces from a portion of the posterior facet that were discarded, with resultant shortening and widening of the heel  OPERATIVE PROCEDURE:  After receiving prophylactic antibiotics, the patient was escorted to the operative theatre and placed in a supine position.  A surgical "time-out" was performed during which the planned procedure, proposed operative site, and the correct patient identity were compared to the operative consent and agreement confirmed by the circulating nurse according to current facility policy.  Following application of a tourniquet to the operative extremity, the exposed skin was prepped with Chloraprep and draped in the usual sterile fashion.  The limb was exsanguinated with an Esmarch bandage and the tourniquet inflated to approximately higher than  systolic BP.  A typical extended lateral approach was made sharply with a scalpel.  Full-thickness flap was elevated. Handling was minimal. 3 different 0.062 inch K wires were used to provide procedure retraction of the flap was elevated. The peroneal tendons were identified and raising the flap. The fragments of the calcaneus were disimpacted. A posterior facet was evaluated and found to have some loose chondral fragments that were removed. The large portion of bone containing half of the posterior facet was manipulated and ultimately reduced and temporarily pinned to the anterior portion of the calcaneus. The stump was repeated several times with interval continued disimpacting the fragments until a more normal calcaneal morphology was achieved. Restoration of the posterior facet was reasonable, heel length was regained and the heel was not in varus.  Once satisfied with the reduction and provisional fixation, the small plate from the Biomet set was selected. It was secured first with K wires in a standard fashion followed by 3 nonlocking screws. This applied the plate nicely to the lateral surface of the bone. The remainder of the screws were sequentially drilled and filled with locking screws. Fluoroscopy was used throughout the procedure to evaluate the position of the hardware. At one point, one of the screws was found to have penetrated the posterior facet and this was replaced with a different orientation. Once satisfied with additional hardware, final images were obtained. The wound is copiously irrigated tourniquet released and additional hemostasis obtained.  A flap was reapproximated with 2-0 nylon vertical mattress sutures. A bulky dressing with a plaster stirrup was applied the patient was awakened and taken to room stable condition, breathing spontaneously.  Disposition: If the patient's pain control is reasonable, should be discharged home today with typical postop instructions, nonweightbearing  right lower extremity  with crutches. She should return 10-15 days for reassessment with new x-rays of the right calcaneus. At that time, we'll determine whether to apply a walking boot at night. She should be instructed in range of motion exercises of the ankle. Patient's pain control is inadequate, should be kept overnight in overnight recovery and discharge tomorrow. She was given instructions to take her 81 mg aspirin twice daily for the next 3 weeks.

## 2012-05-14 NOTE — Anesthesia Preprocedure Evaluation (Addendum)
Anesthesia Evaluation  Patient identified by MRN, date of birth, ID band Patient awake    Reviewed: Allergy & Precautions, H&P , NPO status , Patient's Chart, lab work & pertinent test results  History of Anesthesia Complications (+) PONV  Airway Mallampati: II TM Distance: >3 FB Neck ROM: Full    Dental No notable dental hx. (+) Teeth Intact, Partial Upper and Dental Advisory Given   Pulmonary neg pulmonary ROS,  breath sounds clear to auscultation  Pulmonary exam normal       Cardiovascular hypertension, On Medications Rhythm:Regular Rate:Normal     Neuro/Psych PSYCHIATRIC DISORDERS negative neurological ROS     GI/Hepatic negative GI ROS, Neg liver ROS,   Endo/Other  negative endocrine ROS  Renal/GU negative Renal ROS  negative genitourinary   Musculoskeletal   Abdominal   Peds  Hematology negative hematology ROS (+)   Anesthesia Other Findings   Reproductive/Obstetrics negative OB ROS                          Anesthesia Physical Anesthesia Plan  ASA: II  Anesthesia Plan: General and Regional   Post-op Pain Management:    Induction: Intravenous  Airway Management Planned: LMA  Additional Equipment:   Intra-op Plan:   Post-operative Plan: Extubation in OR  Informed Consent: I have reviewed the patients History and Physical, chart, labs and discussed the procedure including the risks, benefits and alternatives for the proposed anesthesia with the patient or authorized representative who has indicated his/her understanding and acceptance.   Dental advisory given  Plan Discussed with: CRNA  Anesthesia Plan Comments:         Anesthesia Quick Evaluation

## 2012-05-14 NOTE — Interval H&P Note (Signed)
History and Physical Interval Note:  05/14/2012 8:16 AM  Cassandra Mccullough  has presented today for surgery, with the diagnosis of RIGHT CALCANEOUS FRACTURE  The various methods of treatment have been discussed with the patient and family. After consideration of risks, benefits and other options for treatment, the patient has consented to  Procedure(s) (LRB) with comments: OPEN REDUCTION INTERNAL FIXATION (ORIF) CALCANEOUS FRACTURE (Right) as a surgical intervention .  The patient's history has been reviewed, patient examined, no change in status, stable for surgery.  I have reviewed the patient's chart and labs.  Questions were answered to the patient's satisfaction.     [name]

## 2012-05-15 ENCOUNTER — Ambulatory Visit: Payer: PRIVATE HEALTH INSURANCE | Admitting: Cardiology

## 2012-05-19 ENCOUNTER — Encounter (HOSPITAL_BASED_OUTPATIENT_CLINIC_OR_DEPARTMENT_OTHER): Payer: Self-pay | Admitting: Orthopedic Surgery

## 2013-02-27 ENCOUNTER — Emergency Department (HOSPITAL_COMMUNITY)
Admission: EM | Admit: 2013-02-27 | Discharge: 2013-02-27 | Disposition: A | Discharge status: Home / Self Care | Payer: BC Managed Care – PPO | Attending: Emergency Medicine | Admitting: Emergency Medicine

## 2013-02-27 ENCOUNTER — Encounter (HOSPITAL_COMMUNITY): Payer: Self-pay | Admitting: *Deleted

## 2013-02-27 DIAGNOSIS — I10 Essential (primary) hypertension: Secondary | ICD-10-CM | POA: Insufficient documentation

## 2013-02-27 DIAGNOSIS — Z9889 Other specified postprocedural states: Secondary | ICD-10-CM | POA: Insufficient documentation

## 2013-02-27 DIAGNOSIS — Y9389 Activity, other specified: Secondary | ICD-10-CM | POA: Insufficient documentation

## 2013-02-27 DIAGNOSIS — Z7982 Long term (current) use of aspirin: Secondary | ICD-10-CM | POA: Insufficient documentation

## 2013-02-27 DIAGNOSIS — Z8719 Personal history of other diseases of the digestive system: Secondary | ICD-10-CM | POA: Insufficient documentation

## 2013-02-27 DIAGNOSIS — Y929 Unspecified place or not applicable: Secondary | ICD-10-CM | POA: Insufficient documentation

## 2013-02-27 DIAGNOSIS — Z23 Encounter for immunization: Secondary | ICD-10-CM | POA: Insufficient documentation

## 2013-02-27 DIAGNOSIS — S81009A Unspecified open wound, unspecified knee, initial encounter: Secondary | ICD-10-CM | POA: Insufficient documentation

## 2013-02-27 DIAGNOSIS — Z8659 Personal history of other mental and behavioral disorders: Secondary | ICD-10-CM | POA: Insufficient documentation

## 2013-02-27 DIAGNOSIS — Z8639 Personal history of other endocrine, nutritional and metabolic disease: Secondary | ICD-10-CM | POA: Insufficient documentation

## 2013-02-27 DIAGNOSIS — W278XXA Contact with other nonpowered hand tool, initial encounter: Secondary | ICD-10-CM | POA: Insufficient documentation

## 2013-02-27 DIAGNOSIS — Z862 Personal history of diseases of the blood and blood-forming organs and certain disorders involving the immune mechanism: Secondary | ICD-10-CM | POA: Insufficient documentation

## 2013-02-27 DIAGNOSIS — Z87891 Personal history of nicotine dependence: Secondary | ICD-10-CM | POA: Insufficient documentation

## 2013-02-27 DIAGNOSIS — S81811A Laceration without foreign body, right lower leg, initial encounter: Secondary | ICD-10-CM

## 2013-02-27 DIAGNOSIS — Z79899 Other long term (current) drug therapy: Secondary | ICD-10-CM | POA: Insufficient documentation

## 2013-02-27 DIAGNOSIS — Z87828 Personal history of other (healed) physical injury and trauma: Secondary | ICD-10-CM | POA: Insufficient documentation

## 2013-02-27 MED ORDER — LIDOCAINE-EPINEPHRINE 2 %-1:100000 IJ SOLN: 20.0000 mL | Freq: Once | INTRAMUSCULAR | Status: DC

## 2013-02-27 MED ORDER — BACITRACIN ZINC 500 UNIT/GM EX OINT
1.0000 "application " | TOPICAL_OINTMENT | Freq: Two times a day (BID) | CUTANEOUS | Status: DC
  Administered 2013-02-27: 1 via TOPICAL
  Filled 2013-02-27: qty 0.9

## 2013-02-27 MED ORDER — TRAMADOL HCL 50 MG PO TABS: 50.0000 mg | ORAL_TABLET | Freq: Four times a day (QID) | ORAL | Status: DC | PRN

## 2013-02-27 MED ORDER — TETANUS-DIPHTH-ACELL PERTUSSIS 5-2.5-18.5 LF-MCG/0.5 IM SUSP
0.5000 mL | Freq: Once | INTRAMUSCULAR | Status: AC
  Administered 2013-02-27: 0.5 mL via INTRAMUSCULAR
  Filled 2013-02-27: qty 0.5

## 2013-02-27 MED ORDER — LIDOCAINE-EPINEPHRINE (PF) 2 %-1:200000 IJ SOLN
INTRAMUSCULAR | Status: AC
  Administered 2013-02-27: 10 mL
  Filled 2013-02-27: qty 20

## 2013-02-27 NOTE — ED Notes (Signed)
Laceration to right knee from box cutter.

## 2013-02-27 NOTE — ED Provider Notes (Signed)
Scribed for Vida Roller, MD, the patient was seen in room APA11/APA11. This chart was scribed by Lewanda Rife, ED scribe. Patient's care was started at 1558  CSN: [id number]     Arrival date & time 02/27/13  1555 History   First MD Initiated Contact with Patient 02/27/13 1555     Chief Complaint  Patient presents with  . Extremity Laceration   (Consider location/radiation/quality/duration/timing/severity/associated sxs/prior Treatment) The history is provided by the patient.   HPI Comments: Cassandra Mccullough is a 62 y.o. female who presents to the Emergency Department complaining of laceration on right lateral knee onset acute with a razor one hour prior to arrival. Reports pain is moderate and persistent. Reports pain is exacerbated by touch and alleviated by nothing. Denies associated change in vision, numbness, weakness, or any other complaints. Reports tetanus status is not up to date.  Past Medical History  Diagnosis Date  . Depression   . Dyslipidemia   . PONV (postoperative nausea and vomiting)   . Heartburn     occasional - OTC as needed  . History of GI bleed   . Hypertension     under control, has been on med. x 5-6 yrs.  . Wears partial dentures     upper and lower  . Dental crowns present   . Blister of heel 05/2012    fracture blisters right heel  . Calcaneus fracture, right 04/29/2012   Past Surgical History  Procedure Laterality Date  . Posterior lumbar fusion  10/04/2004  . Appendectomy    . Foot surgery      right  . Anterior cervical discectomy  07/18/1998    C5-6, C6-7 with arthrodesis  . Abdominal hysterectomy      partial  . Tracheostomy    . Tympanoplasty    . Bilateral oophorectomy      with bilat. salpingectomy  . Orif calcaneous fracture  05/14/2012    Procedure: OPEN REDUCTION INTERNAL FIXATION (ORIF) CALCANEOUS FRACTURE;  Surgeon: Jodi Marble, MD;  Location: [facility];  Service: Orthopedics;  Laterality: Right;    Family History  Problem Relation Age of Onset  . Prostate cancer Father   . Colon cancer Mother    History  Substance Use Topics  . Smoking status: Former Games developer  . Smokeless tobacco: Never Used     Comment: quit smoking > 10 yrs. ago  . Alcohol Use: No   OB History   Grav Para Term Preterm Abortions TAB SAB Ect Mult Living                 Review of Systems  Eyes: Negative for visual disturbance.  Skin: Positive for wound.  Neurological: Negative for weakness and numbness.  All other systems reviewed and are negative.    Allergies  Aspirin and Nsaids  Home Medications   Current Outpatient Rx  Name  Route  Sig  Dispense  Refill  . amLODipine (NORVASC) 10 MG tablet   Oral   Take 10 mg by mouth at bedtime.          Marland Kitchen aspirin 81 MG tablet   Oral   Take 81 mg by mouth every morning.          . cholecalciferol (VITAMIN D) 1000 UNITS tablet   Oral   Take 1,000 Units by mouth 2 (two) times daily.          . fish oil-omega-3 fatty acids 1000 MG capsule   Oral  Take 1,500 mg by mouth daily.          Marland Kitchen FLUoxetine (PROZAC) 40 MG capsule   Oral   Take 60 mg by mouth at bedtime.          Marland Kitchen glucosamine-chondroitin 500-400 MG tablet   Oral   Take 1 tablet by mouth every morning.          Marland Kitchen losartan-hydrochlorothiazide (HYZAAR) 100-25 MG per tablet   Oral   Take 1 tablet by mouth every morning.          . Multiple Vitamins-Minerals (MULTIVITAMIN PO)   Oral   Take 1 tablet by mouth every morning.          . Probiotic Product (PROBIOTIC FORMULA PO)   Oral   Take 1 tablet by mouth every morning.         . vitamin B-12 (CYANOCOBALAMIN) 1000 MCG tablet   Oral   Take 1,000 mcg by mouth once a week.          . traMADol (ULTRAM) 50 MG tablet   Oral   Take 1 tablet (50 mg total) by mouth every 6 (six) hours as needed for pain.   15 tablet   0    BP 152/76  Pulse 76  SpO2 97% Physical Exam  Nursing note and vitals  reviewed. Constitutional: She is oriented to person, place, and time. She appears well-developed and well-nourished. No distress.  HENT:  Head: Normocephalic and atraumatic.  Eyes: EOM are normal.  Neck: Neck supple. No tracheal deviation present.  Cardiovascular: Normal rate.   Pulmonary/Chest: Effort normal. No respiratory distress.  Abdominal: Soft. There is no tenderness.  Musculoskeletal: Normal range of motion.  Able to straight-leg raise on the right side without difficulty.  Neurological: She is alert and oriented to person, place, and time.  No numbness tingling or weakness to the right lower extremity at the major muscle groups or joints  Skin: Skin is warm and dry. Laceration ( Lower extremity, distal thigh through the lateral inferior knee on the lateral surface. Superficial until Monday at which time it becomes into the subcutaneous fat) noted.  Psychiatric: She has a normal mood and affect. Her behavior is normal.    ED Course  Procedures (including critical care time) Medications  lidocaine-EPINEPHrine (XYLOCAINE W/EPI) 2 %-1:100000 (with pres) injection 20 mL (20 mLs Intradermal Not Given 02/27/13 1622)  bacitracin ointment 1 application (1 application Topical Given 02/27/13 1622)  TDaP (BOOSTRIX) injection 0.5 mL (0.5 mLs Intramuscular Given 02/27/13 1616)  lidocaine-EPINEPHrine (XYLOCAINE W/EPI) 2 %-1:200000 (PF) injection (10 mLs  Given 02/27/13 1622)   LACERATION REPAIR Performed by: Eber Hong MD  Consent: Verbal consent obtained. Risks and benefits: risks, benefits and alternatives were discussed Patient identity confirmed: provided demographic data Time out performed prior to procedure Prepped and Draped in normal sterile fashion Wound explored Laceration Location: Right lower extremity Laceration Length: 10 cm  No Foreign Bodies seen or palpated Anesthesia: local infiltration Local anesthetic: lidocaine 2% with epinephrine Anesthetic total: 4 ml Irrigation  method: syringe Amount of cleaning: standard Skin closure: 4-0 Prolene  Number of sutures or staples: 5  Technique: Horizontal mattress, Dermabond  Patient tolerance: Patient tolerated the procedure well with no immediate complications.  The proximal 6 and a meters of the wound was closed with Dermabond and Steri-Strips, the distal 4 cm was closed with 5 horizontal mattress sutures. The wound was deeply probed to the bottom of the wound, there is no foreign bodies,  there is no exposed joint or tendons. The knee was stable in all range of motion without pain with flexion.  Labs Review Labs Reviewed - No data to display Imaging Review No results found.  MDM   1. Laceration of right lower leg, initial encounter    Knee repair, tetanus updated, no indication for imaging as this was lacerated with a box cutter, no foreign body seen on physical exam, the patient was given pain medication for home, successful suturing, patient informed of indications for return, knee immobilizer given prior to discharge.   Meds given in ED:  Medications  lidocaine-EPINEPHrine (XYLOCAINE W/EPI) 2 %-1:100000 (with pres) injection 20 mL (20 mLs Intradermal Not Given 02/27/13 1622)  bacitracin ointment 1 application (1 application Topical Given 02/27/13 1622)  TDaP (BOOSTRIX) injection 0.5 mL (0.5 mLs Intramuscular Given 02/27/13 1616)  lidocaine-EPINEPHrine (XYLOCAINE W/EPI) 2 %-1:200000 (PF) injection (10 mLs  Given 02/27/13 1622)    New Prescriptions   TRAMADOL (ULTRAM) 50 MG TABLET    Take 1 tablet (50 mg total) by mouth every 6 (six) hours as needed for pain.      I personally performed the services described in this documentation, which was scribed in my presence. The recorded information has been reviewed and is accurate.      Vida Roller, MD 02/27/13 [phone]

## 2013-03-07 ENCOUNTER — Emergency Department (HOSPITAL_COMMUNITY)
Admission: EM | Admit: 2013-03-07 | Discharge: 2013-03-07 | Disposition: A | Discharge status: Home / Self Care | Payer: BC Managed Care – PPO | Attending: Emergency Medicine | Admitting: Emergency Medicine

## 2013-03-07 ENCOUNTER — Emergency Department (HOSPITAL_COMMUNITY): Payer: BC Managed Care – PPO

## 2013-03-07 ENCOUNTER — Encounter (HOSPITAL_COMMUNITY): Payer: Self-pay | Admitting: *Deleted

## 2013-03-07 DIAGNOSIS — Z79899 Other long term (current) drug therapy: Secondary | ICD-10-CM | POA: Insufficient documentation

## 2013-03-07 DIAGNOSIS — Z87891 Personal history of nicotine dependence: Secondary | ICD-10-CM | POA: Insufficient documentation

## 2013-03-07 DIAGNOSIS — G8929 Other chronic pain: Secondary | ICD-10-CM | POA: Insufficient documentation

## 2013-03-07 DIAGNOSIS — Z7982 Long term (current) use of aspirin: Secondary | ICD-10-CM | POA: Insufficient documentation

## 2013-03-07 DIAGNOSIS — I10 Essential (primary) hypertension: Secondary | ICD-10-CM | POA: Insufficient documentation

## 2013-03-07 DIAGNOSIS — M549 Dorsalgia, unspecified: Secondary | ICD-10-CM | POA: Insufficient documentation

## 2013-03-07 DIAGNOSIS — Z8659 Personal history of other mental and behavioral disorders: Secondary | ICD-10-CM | POA: Insufficient documentation

## 2013-03-07 DIAGNOSIS — R079 Chest pain, unspecified: Secondary | ICD-10-CM

## 2013-03-07 DIAGNOSIS — Z87828 Personal history of other (healed) physical injury and trauma: Secondary | ICD-10-CM | POA: Insufficient documentation

## 2013-03-07 DIAGNOSIS — Z862 Personal history of diseases of the blood and blood-forming organs and certain disorders involving the immune mechanism: Secondary | ICD-10-CM | POA: Insufficient documentation

## 2013-03-07 DIAGNOSIS — Z8719 Personal history of other diseases of the digestive system: Secondary | ICD-10-CM | POA: Insufficient documentation

## 2013-03-07 DIAGNOSIS — IMO0001 Reserved for inherently not codable concepts without codable children: Secondary | ICD-10-CM | POA: Insufficient documentation

## 2013-03-07 DIAGNOSIS — R0789 Other chest pain: Secondary | ICD-10-CM | POA: Insufficient documentation

## 2013-03-07 DIAGNOSIS — Z8639 Personal history of other endocrine, nutritional and metabolic disease: Secondary | ICD-10-CM | POA: Insufficient documentation

## 2013-03-07 LAB — TROPONIN I: Troponin I: <0.3 ng/mL (ref <0.30)

## 2013-03-07 LAB — CBC WITH DIFFERENTIAL/PLATELET
Basophils Absolute: 0.1 10*3/uL (ref 0.0–0.1)
Basophils Relative: 1 % (ref 0–1)
Eosinophils Absolute: 0.3 10*3/uL (ref 0.0–0.7)
Eosinophils Relative: 2 % (ref 0–5)
HCT: 39.3 % (ref 36.0–46.0)
Hemoglobin: 13.7 g/dL (ref 12.0–15.0)
Lymphocytes Relative: 31 % (ref 12–46)
Lymphs Abs: 4.5 10*3/uL — ABNORMAL HIGH (ref 0.7–4.0)
MCH: 31.4 pg (ref 26.0–34.0)
MCHC: 34.9 g/dL (ref 30.0–36.0)
MCV: 89.9 fL (ref 78.0–100.0)
Monocytes Absolute: 1 10*3/uL (ref 0.1–1.0)
Monocytes Relative: 7 % (ref 3–12)
Neutro Abs: 8.8 10*3/uL — ABNORMAL HIGH (ref 1.7–7.7)
Neutrophils Relative %: 60 % (ref 43–77)
Platelets: 282 10*3/uL (ref 150–400)
RBC: 4.37 MIL/uL (ref 3.87–5.11)
RDW: 12.5 % (ref 11.5–15.5)
WBC: 14.7 10*3/uL — ABNORMAL HIGH (ref 4.0–10.5)

## 2013-03-07 LAB — BASIC METABOLIC PANEL
BUN: 19 mg/dL (ref 6–23)
CO2: 25 mEq/L (ref 19–32)
Calcium: 10.1 mg/dL (ref 8.4–10.5)
Chloride: 99 mEq/L (ref 96–112)
Creatinine, Ser: 0.93 mg/dL (ref 0.50–1.10)
GFR calc Af Amer: 75 mL/min — ABNORMAL LOW (ref >90)
GFR calc non Af Amer: 65 mL/min — ABNORMAL LOW (ref >90)
Glucose, Bld: 117 mg/dL — ABNORMAL HIGH (ref 70–99)
Potassium: 2.9 mEq/L — ABNORMAL LOW (ref 3.5–5.1)
Sodium: 139 mEq/L (ref 135–145)

## 2013-03-07 LAB — D-DIMER, QUANTITATIVE (NOT AT ARMC): D-Dimer, Quant: 0.72 ug/mL-FEU — ABNORMAL HIGH (ref 0.00–0.48)

## 2013-03-07 MED ORDER — POTASSIUM CHLORIDE CRYS ER 20 MEQ PO TBCR
60.0000 meq | EXTENDED_RELEASE_TABLET | Freq: Once | ORAL | Status: AC
  Administered 2013-03-07: 60 meq via ORAL
  Filled 2013-03-07: qty 3

## 2013-03-07 MED ORDER — IOHEXOL 350 MG/ML SOLN
100.0000 mL | Freq: Once | INTRAVENOUS | Status: AC | PRN
  Administered 2013-03-07: 100 mL via INTRAVENOUS

## 2013-03-07 MED ORDER — OXYCODONE-ACETAMINOPHEN 5-325 MG PO TABS
2.0000 | ORAL_TABLET | Freq: Once | ORAL | Status: AC
  Administered 2013-03-07: 2 via ORAL
  Filled 2013-03-07: qty 2

## 2013-03-07 MED ORDER — POTASSIUM CHLORIDE 10 MEQ/100ML IV SOLN
10.0000 meq | Freq: Once | INTRAVENOUS | Status: AC
  Administered 2013-03-07: 10 meq via INTRAVENOUS
  Filled 2013-03-07: qty 100

## 2013-03-07 MED ORDER — SODIUM CHLORIDE 0.9 % IV BOLUS (SEPSIS)
1000.0000 mL | Freq: Once | INTRAVENOUS | Status: AC
  Administered 2013-03-07: 1000 mL via INTRAVENOUS

## 2013-03-07 MED ORDER — LORAZEPAM 2 MG/ML IJ SOLN
1.0000 mg | Freq: Once | INTRAMUSCULAR | Status: AC
  Administered 2013-03-07: 1 mg via INTRAVENOUS
  Filled 2013-03-07: qty 1

## 2013-03-07 MED ORDER — ONDANSETRON HCL 4 MG/2ML IJ SOLN
4.0000 mg | Freq: Once | INTRAMUSCULAR | Status: AC
  Administered 2013-03-07: 4 mg via INTRAVENOUS
  Filled 2013-03-07: qty 2

## 2013-03-07 MED ORDER — TRAMADOL HCL 50 MG PO TABS: 50.0000 mg | ORAL_TABLET | Freq: Four times a day (QID) | ORAL | Status: DC | PRN

## 2013-03-07 MED ORDER — HYDROMORPHONE HCL PF 1 MG/ML IJ SOLN
1.0000 mg | Freq: Once | INTRAMUSCULAR | Status: AC
  Administered 2013-03-07: 1 mg via INTRAVENOUS
  Filled 2013-03-07: qty 1

## 2013-03-07 NOTE — ED Notes (Signed)
Patient given discharge instruction, verbalized understand. IV removed, band aid applied. Patient in wheelchair out of the department.  

## 2013-03-07 NOTE — ED Notes (Signed)
EMS student at the bedside to start IV

## 2013-03-07 NOTE — ED Notes (Signed)
Pt c/o sharp stabbing right side chest pain that started earlier in the day, pain is associated with sob and is made worse moving her right arm.

## 2013-03-07 NOTE — ED Provider Notes (Signed)
CSN: [id number]     Arrival date & time 03/07/13  1838 History   First MD Initiated Contact with Patient 03/07/13 1944     Chief Complaint  Patient presents with  . Chest Pain   (Consider location/radiation/quality/duration/timing/severity/associated sxs/prior Treatment) HPI  62 year old female with chest pain. Right sided. Onset waking her up this morning. Pain is in the right chest and right axilla. Sharp in nature. Worse with palpation and range of motion of her right shoulder. No appreciable relieving factors. No shortness of breath. No fevers or chills. No coughing. Former smoker. Denies drug use. No unusual leg pain or swelling. No history of blood clot. No history similar symptoms. Denies any trauma.  Past Medical History  Diagnosis Date  . Depression   . Dyslipidemia   . PONV (postoperative nausea and vomiting)   . Heartburn     occasional - OTC as needed  . History of GI bleed   . Hypertension     under control, has been on med. x 5-6 yrs.  . Wears partial dentures     upper and lower  . Dental crowns present   . Blister of heel 05/2012    fracture blisters right heel  . Calcaneus fracture, right 04/29/2012   Past Surgical History  Procedure Laterality Date  . Posterior lumbar fusion  10/04/2004  . Appendectomy    . Foot surgery      right  . Anterior cervical discectomy  07/18/1998    C5-6, C6-7 with arthrodesis  . Abdominal hysterectomy      partial  . Tracheostomy    . Tympanoplasty    . Bilateral oophorectomy      with bilat. salpingectomy  . Orif calcaneous fracture  05/14/2012    Procedure: OPEN REDUCTION INTERNAL FIXATION (ORIF) CALCANEOUS FRACTURE;  Surgeon: Jodi Marble, MD;  Location: [facility];  Service: Orthopedics;  Laterality: Right;   Family History  Problem Relation Age of Onset  . Prostate cancer Father   . Colon cancer Mother    History  Substance Use Topics  . Smoking status: Former Games developer  . Smokeless tobacco: Never  Used     Comment: quit smoking > 10 yrs. ago  . Alcohol Use: No   OB History   Grav Para Term Preterm Abortions TAB SAB Ect Mult Living                 Review of Systems  All systems reviewed and negative, other than as noted in HPI.   Allergies  Aspirin and Nsaids  Home Medications   Current Outpatient Rx  Name  Route  Sig  Dispense  Refill  . amLODipine (NORVASC) 10 MG tablet   Oral   Take 10 mg by mouth at bedtime.          Marland Kitchen aspirin 81 MG tablet   Oral   Take 81 mg by mouth every morning.          . cholecalciferol (VITAMIN D) 1000 UNITS tablet   Oral   Take 1,000 Units by mouth 2 (two) times daily.          . fish oil-omega-3 fatty acids 1000 MG capsule   Oral   Take 1,500 mg by mouth daily.          Marland Kitchen glucosamine-chondroitin 500-400 MG tablet   Oral   Take 1 tablet by mouth every morning.          Marland Kitchen losartan-hydrochlorothiazide (HYZAAR) 100-25 MG  per tablet   Oral   Take 1 tablet by mouth every morning.          . Multiple Vitamins-Minerals (MULTIVITAMIN PO)   Oral   Take 1 tablet by mouth every morning.          . Probiotic Product (PROBIOTIC FORMULA PO)   Oral   Take 1 tablet by mouth every morning.         . propranolol (INDERAL) 40 MG tablet   Oral   Take 60 mg by mouth daily.         . traMADol (ULTRAM) 50 MG tablet   Oral   Take 1 tablet (50 mg total) by mouth every 6 (six) hours as needed for pain.   15 tablet   0   . vitamin B-12 (CYANOCOBALAMIN) 1000 MCG tablet   Oral   Take 1,000 mcg by mouth once a week.           BP 145/78  Pulse 69  Temp(Src) 97.6 F (36.4 C) (Oral)  Resp 20  Ht 6' (1.829 m)  Wt 179 lb (81.194 kg)  BMI 24.27 kg/m2  SpO2 100% Physical Exam  Nursing note and vitals reviewed. Constitutional: She appears well-developed and well-nourished. She appears distressed.  Sitting up in bed rocking. Uncomfortable appearing. Holding right chest.  HENT:  Head: Normocephalic and atraumatic.   Eyes: Conjunctivae are normal. Right eye exhibits no discharge. Left eye exhibits no discharge.  Neck: Neck supple.  Cardiovascular: Normal rate, regular rhythm and normal heart sounds.  Exam reveals no gallop and no friction rub.   No murmur heard. Pulmonary/Chest: Effort normal and breath sounds normal. No respiratory distress. She has no wheezes.  Abdominal: Soft. She exhibits no distension. There is no tenderness.  Musculoskeletal: She exhibits no edema and no tenderness.  Neurological: She is alert.  Skin: Skin is warm and dry.  Psychiatric: She has a normal mood and affect. Her behavior is normal. Thought content normal.    ED Course  Procedures (including critical care time) Labs Review Labs Reviewed  CBC WITH DIFFERENTIAL - Abnormal; Notable for the following:    WBC 14.7 (*)    Neutro Abs 8.8 (*)    Lymphs Abs 4.5 (*)    All other components within normal limits  BASIC METABOLIC PANEL - Abnormal; Notable for the following:    Potassium 2.9 (*)    Glucose, Bld 117 (*)    GFR calc non Af Amer 65 (*)    GFR calc Af Amer 75 (*)    All other components within normal limits  D-DIMER, QUANTITATIVE - Abnormal; Notable for the following:    D-Dimer, Quant 0.72 (*)    All other components within normal limits  TROPONIN I   Imaging Review Dg Chest 2 View  03/07/2013   *RADIOLOGY REPORT*  Clinical Data: Right chest pain, lethargy, history hypertension  CHEST - 2 VIEW  Comparison: 04/23/2012  Findings: Enlargement of cardiac silhouette. Mediastinal contours and pulmonary vascularity normal. Lungs mildly hyperinflated. Probable small bleb at right base. Bibasilar atelectasis. No infiltrate, pleural effusion or pneumothorax. Prior cervical spine fusion.  IMPRESSION: Hyperinflated lungs with bibasilar atelectasis. Enlargement of cardiac silhouette.   Original Report Authenticated By: Ulyses Southward, M.D.   KG:  Rhythm: normal sinus Vent. rate 61 BPM PR interval 154 ms QRS duration 98  ms QT/QTc 462/465 ms ST segments: NS ST changes. Diffuse t wave flattening.    MDM  No diagnosis found.  62 year old female  with chest pain. EKG changed from previous from 05/2012 but these changes are nonspecific. Symptoms atypical for ACS. Sharp nature. Very reproducible with both palpation and ROM of R shoulder. Troponin is normal more than 10 hours after the onset of symptoms.     Raeford Razor, MD 03/11/13 [phone]

## 2013-03-11 ENCOUNTER — Emergency Department (HOSPITAL_COMMUNITY): Payer: BC Managed Care – PPO

## 2013-03-11 ENCOUNTER — Emergency Department (HOSPITAL_COMMUNITY)
Admission: EM | Admit: 2013-03-11 | Discharge: 2013-03-11 | Disposition: A | Discharge status: Home / Self Care | Payer: BC Managed Care – PPO | Attending: Emergency Medicine | Admitting: Emergency Medicine

## 2013-03-11 ENCOUNTER — Encounter (HOSPITAL_COMMUNITY): Payer: Self-pay

## 2013-03-11 DIAGNOSIS — R0789 Other chest pain: Secondary | ICD-10-CM

## 2013-03-11 DIAGNOSIS — M7918 Myalgia, other site: Secondary | ICD-10-CM

## 2013-03-11 DIAGNOSIS — I10 Essential (primary) hypertension: Secondary | ICD-10-CM | POA: Insufficient documentation

## 2013-03-11 DIAGNOSIS — Z87891 Personal history of nicotine dependence: Secondary | ICD-10-CM | POA: Insufficient documentation

## 2013-03-11 DIAGNOSIS — Z7982 Long term (current) use of aspirin: Secondary | ICD-10-CM | POA: Insufficient documentation

## 2013-03-11 DIAGNOSIS — IMO0001 Reserved for inherently not codable concepts without codable children: Secondary | ICD-10-CM | POA: Insufficient documentation

## 2013-03-11 DIAGNOSIS — R071 Chest pain on breathing: Secondary | ICD-10-CM | POA: Insufficient documentation

## 2013-03-11 DIAGNOSIS — Z79899 Other long term (current) drug therapy: Secondary | ICD-10-CM | POA: Insufficient documentation

## 2013-03-11 DIAGNOSIS — Z8719 Personal history of other diseases of the digestive system: Secondary | ICD-10-CM | POA: Insufficient documentation

## 2013-03-11 DIAGNOSIS — F329 Major depressive disorder, single episode, unspecified: Secondary | ICD-10-CM | POA: Insufficient documentation

## 2013-03-11 DIAGNOSIS — E785 Hyperlipidemia, unspecified: Secondary | ICD-10-CM | POA: Insufficient documentation

## 2013-03-11 DIAGNOSIS — F3289 Other specified depressive episodes: Secondary | ICD-10-CM | POA: Insufficient documentation

## 2013-03-11 DIAGNOSIS — Z8781 Personal history of (healed) traumatic fracture: Secondary | ICD-10-CM | POA: Insufficient documentation

## 2013-03-11 HISTORY — DX: Other chronic pain: G89.29

## 2013-03-11 HISTORY — DX: Dorsalgia, unspecified: M54.9

## 2013-03-11 HISTORY — DX: Fibromyalgia: M79.7

## 2013-03-11 LAB — CBC WITH DIFFERENTIAL/PLATELET
Basophils Absolute: 0.1 10*3/uL (ref 0.0–0.1)
Eosinophils Absolute: 0.2 10*3/uL (ref 0.0–0.7)
Eosinophils Relative: 1 % (ref 0–5)
HCT: 37.2 % (ref 36.0–46.0)
Lymphocytes Relative: 21 % (ref 12–46)
Lymphs Abs: 3.3 10*3/uL (ref 0.7–4.0)
MCH: 30.6 pg (ref 26.0–34.0)
MCV: 89 fL (ref 78.0–100.0)
Monocytes Absolute: 1.3 10*3/uL — ABNORMAL HIGH (ref 0.1–1.0)
Platelets: 285 10*3/uL (ref 150–400)
RDW: 12.3 % (ref 11.5–15.5)

## 2013-03-11 LAB — COMPREHENSIVE METABOLIC PANEL
AST: 11 U/L (ref 0–37)
BUN: 18 mg/dL (ref 6–23)
CO2: 28 mEq/L (ref 19–32)
Calcium: 9.7 mg/dL (ref 8.4–10.5)
Chloride: 101 mEq/L (ref 96–112)
Creatinine, Ser: 0.83 mg/dL (ref 0.50–1.10)
GFR calc Af Amer: 86 mL/min — ABNORMAL LOW (ref >90)
GFR calc non Af Amer: 75 mL/min — ABNORMAL LOW (ref >90)
Total Bilirubin: 0.7 mg/dL (ref 0.3–1.2)

## 2013-03-11 LAB — LIPASE, BLOOD: Lipase: 27 U/L (ref 11–59)

## 2013-03-11 LAB — TROPONIN I: Troponin I: <0.3 ng/mL (ref <0.30)

## 2013-03-11 MED ORDER — METHOCARBAMOL 500 MG PO TABS: 1000.0000 mg | ORAL_TABLET | Freq: Four times a day (QID) | ORAL | Status: DC | PRN

## 2013-03-11 MED ORDER — ONDANSETRON HCL 4 MG/2ML IJ SOLN
4.0000 mg | INTRAMUSCULAR | Status: DC | PRN
  Administered 2013-03-11: 4 mg via INTRAVENOUS
  Filled 2013-03-11: qty 2

## 2013-03-11 MED ORDER — FAMOTIDINE IN NACL 20-0.9 MG/50ML-% IV SOLN
20.0000 mg | Freq: Once | INTRAVENOUS | Status: AC
  Administered 2013-03-11: 20 mg via INTRAVENOUS
  Filled 2013-03-11: qty 50

## 2013-03-11 MED ORDER — SODIUM CHLORIDE 0.9 % IV SOLN
INTRAVENOUS | Status: DC
  Administered 2013-03-11: 16:00:00 via INTRAVENOUS

## 2013-03-11 MED ORDER — MORPHINE SULFATE 4 MG/ML IJ SOLN
4.0000 mg | INTRAMUSCULAR | Status: AC | PRN
  Administered 2013-03-11 (×2): 4 mg via INTRAVENOUS
  Filled 2013-03-11 (×2): qty 1

## 2013-03-11 MED ORDER — POTASSIUM CHLORIDE 20 MEQ/15ML (10%) PO LIQD
40.0000 meq | Freq: Once | ORAL | Status: AC
  Administered 2013-03-11: 40 meq via ORAL
  Filled 2013-03-11: qty 30

## 2013-03-11 MED ORDER — OXYCODONE-ACETAMINOPHEN 5-325 MG PO TABS
2.0000 | ORAL_TABLET | Freq: Once | ORAL | Status: AC
  Administered 2013-03-11: 2 via ORAL
  Filled 2013-03-11: qty 2

## 2013-03-11 MED ORDER — OXYCODONE-ACETAMINOPHEN 5-325 MG PO TABS: ORAL_TABLET | ORAL | Status: DC

## 2013-03-11 NOTE — ED Notes (Signed)
Pt reports chest pain for the past few weeks.  Pt reports pain "all over".  Pt denies any sob, n/v, dizziness.  Pt reports being here for same a few weeks ago.

## 2013-03-11 NOTE — ED Provider Notes (Signed)
CSN: [id number]     Arrival date & time 03/11/13  1257 History   First MD Initiated Contact with Patient 03/11/13 1414     Chief Complaint  Patient presents with  . Chest Pain    HPI Pt was seen at 1435.  Per pt, c/o gradual onset and persistence of constant lower mid-sternal and upper abd "pain" for the past 4 to 5 days. Pain worsens with arm movement and body position changes.  Describes the abd pain as "aching." States "now I hurt all over." States she was evaluated in the ED 4 days ago for same, dx chest wall pain, rx ultram. States the ultram "doesn't do anything for my pain."  Denies palpitations, no SOB/cough, no N/V/D, no fevers, no back pain, no rash, no CP/SOB, no black or blood in stools or emesis.       Past Medical History  Diagnosis Date  . Depression   . Dyslipidemia   . PONV (postoperative nausea and vomiting)   . Heartburn     occasional - OTC as needed  . History of GI bleed   . Hypertension     under control, has been on med. x 5-6 yrs.  . Wears partial dentures     upper and lower  . Dental crowns present   . Blister of heel 05/2012    fracture blisters right heel  . Calcaneus fracture, right 04/29/2012  . Chronic back pain   . Fibromyalgia    Past Surgical History  Procedure Laterality Date  . Posterior lumbar fusion  10/04/2004  . Appendectomy    . Foot surgery      right  . Anterior cervical discectomy  07/18/1998    C5-6, C6-7 with arthrodesis  . Abdominal hysterectomy      partial  . Tracheostomy    . Tympanoplasty    . Bilateral oophorectomy      with bilat. salpingectomy  . Orif calcaneous fracture  05/14/2012    Procedure: OPEN REDUCTION INTERNAL FIXATION (ORIF) CALCANEOUS FRACTURE;  Surgeon: Jodi Marble, MD;  Location: [facility];  Service: Orthopedics;  Laterality: Right;   Family History  Problem Relation Age of Onset  . Prostate cancer Father   . Colon cancer Mother    History  Substance Use Topics  . Smoking  status: Former Games developer  . Smokeless tobacco: Never Used     Comment: quit smoking > 10 yrs. ago  . Alcohol Use: No    Review of Systems ROS: Statement: All systems negative except as marked or noted in the HPI; Constitutional: Negative for fever and chills. ; ; Eyes: Negative for eye pain, redness and discharge. ; ; ENMT: Negative for ear pain, hoarseness, nasal congestion, sinus pressure and sore throat. ; ; Cardiovascular: +CP. Negative for palpitations, diaphoresis, dyspnea and peripheral edema. ; ; Respiratory: Negative for cough, wheezing and stridor. ; ; Gastrointestinal: +abd pain. Negative for nausea, vomiting, diarrhea, blood in stool, hematemesis, jaundice and rectal bleeding. . ; ; Genitourinary: Negative for dysuria, flank pain and hematuria. ; ; Musculoskeletal: Negative for back pain and neck pain. Negative for swelling and trauma.; ; Skin: Negative for pruritus, rash, abrasions, blisters, bruising and skin lesion.; ; Neuro: Negative for headache, lightheadedness and neck stiffness. Negative for weakness, altered level of consciousness , altered mental status, extremity weakness, paresthesias, involuntary movement, seizure and syncope.      Allergies  Aspirin and Nsaids  Home Medications   Current Outpatient Rx  Name  Route  Sig  Dispense  Refill  . amLODipine (NORVASC) 10 MG tablet   Oral   Take 10 mg by mouth at bedtime.          Marland Kitchen aspirin 81 MG tablet   Oral   Take 81 mg by mouth every morning.          Marland Kitchen atorvastatin (LIPITOR) 20 MG tablet   Oral   Take 20 mg by mouth daily.         . cholecalciferol (VITAMIN D) 1000 UNITS tablet   Oral   Take 1,000 Units by mouth 2 (two) times daily.          . cyclobenzaprine (FLEXERIL) 5 MG tablet   Oral   Take 5 mg by mouth 2 (two) times daily as needed for muscle spasms.         . fish oil-omega-3 fatty acids 1000 MG capsule   Oral   Take 1,500 mg by mouth daily.          Marland Kitchen FLUoxetine HCl 60 MG TABS    Oral   Take 60 mg by mouth daily.         Marland Kitchen glucosamine-chondroitin 500-400 MG tablet   Oral   Take 1 tablet by mouth every morning.          Marland Kitchen HYDROcodone-acetaminophen (NORCO/VICODIN) 5-325 MG per tablet   Oral   Take 1 tablet by mouth every 6 (six) hours as needed for pain.         Marland Kitchen losartan-hydrochlorothiazide (HYZAAR) 100-25 MG per tablet   Oral   Take 1 tablet by mouth every morning.          . Multiple Vitamins-Minerals (MULTIVITAMIN PO)   Oral   Take 1 tablet by mouth every morning.          . Probiotic Product (PROBIOTIC FORMULA PO)   Oral   Take 1 tablet by mouth every morning.         . vitamin B-12 (CYANOCOBALAMIN) 1000 MCG tablet   Oral   Take 1,000 mcg by mouth once a week.           BP 110/97  Pulse 64  Temp(Src) 97.7 F (36.5 C) (Oral)  Resp 20  Ht 6' (1.829 m)  Wt 179 lb (81.194 kg)  BMI 24.27 kg/m2  SpO2 100% Physical Exam 1440: Physical examination:  Nursing notes reviewed; Vital signs and O2 SAT reviewed;  Constitutional: Well developed, Well nourished, Well hydrated, Uncomfortable appearing; Head:  Normocephalic, atraumatic; Eyes: EOMI, PERRL, No scleral icterus; ENMT: Mouth and pharynx normal, Mucous membranes moist; Neck: Supple, Full range of motion, No lymphadenopathy; Cardiovascular: Regular rate and rhythm, No murmur, rub, or gallop; Respiratory: Breath sounds clear & equal bilaterally, No rales, rhonchi, wheezes.  Speaking full sentences with ease, Normal respiratory effort/excursion; Chest: +TTP lower parasternal and anterior chest wall areas. No rash. No deformity. Movement normal; Abdomen: Soft, +RUQ, mid-epigastric, LUQ tenderness to palp. No rebound or guarding. Nondistended, Normal bowel sounds; Genitourinary: No CVA tenderness; Extremities: Pulses normal, No tenderness, No edema, No calf edema or asymmetry.; Neuro: AA&Ox3, Major CN grossly intact.  Speech clear. No gross focal motor or sensory deficits in extremities.; Skin:  Color normal, Warm, Dry.   ED Course  Procedures     MDM  MDM Reviewed: previous chart, nursing note and vitals Reviewed previous: labs, ECG, x-ray and CT scan Interpretation: labs, ECG, x-ray and ultrasound    Date: 03/11/2013  Rate: 65  Rhythm: normal sinus rhythm  QRS Axis: normal  Intervals: normal  ST/T Wave abnormalities: nonspecific ST/T changes artifact  Conduction Disutrbances:none  Narrative Interpretation:   Old EKG Reviewed: unchanged; no significant changes from previous EKG dated 03/07/2013.  Results for orders placed during the hospital encounter of 03/11/13  CBC WITH DIFFERENTIAL      Result Value Range   WBC 15.7 (*) 4.0 - 10.5 K/uL   RBC 4.18  3.87 - 5.11 MIL/uL   Hemoglobin 12.8  12.0 - 15.0 g/dL   HCT 16.1  09.6 - 04.5 %   MCV 89.0  78.0 - 100.0 fL   MCH 30.6  26.0 - 34.0 pg   MCHC 34.4  30.0 - 36.0 g/dL   RDW 40.9  81.1 - 91.4 %   Platelets 285  150 - 400 K/uL   Neutrophils Relative % 69  43 - 77 %   Neutro Abs 10.9 (*) 1.7 - 7.7 K/uL   Lymphocytes Relative 21  12 - 46 %   Lymphs Abs 3.3  0.7 - 4.0 K/uL   Monocytes Relative 8  3 - 12 %   Monocytes Absolute 1.3 (*) 0.1 - 1.0 K/uL   Eosinophils Relative 1  0 - 5 %   Eosinophils Absolute 0.2  0.0 - 0.7 K/uL   Basophils Relative 0  0 - 1 %   Basophils Absolute 0.1  0.0 - 0.1 K/uL  TROPONIN I      Result Value Range   Troponin I <0.30  <0.30 ng/mL  COMPREHENSIVE METABOLIC PANEL      Result Value Range   Sodium 140  135 - 145 mEq/L   Potassium 3.2 (*) 3.5 - 5.1 mEq/L   Chloride 101  96 - 112 mEq/L   CO2 28  19 - 32 mEq/L   Glucose, Bld 100 (*) 70 - 99 mg/dL   BUN 18  6 - 23 mg/dL   Creatinine, Ser 7.82  0.50 - 1.10 mg/dL   Calcium 9.7  8.4 - 95.6 mg/dL   Total Protein 6.9  6.0 - 8.3 g/dL   Albumin 3.5  3.5 - 5.2 g/dL   AST 11  0 - 37 U/L   ALT 11  0 - 35 U/L   Alkaline Phosphatase 78  39 - 117 U/L   Total Bilirubin 0.7  0.3 - 1.2 mg/dL   GFR calc non Af Amer 75 (*) >90 mL/min   GFR calc  Af Amer 86 (*) >90 mL/min  LIPASE, BLOOD      Result Value Range   Lipase 27  11 - 59 U/L   Dg Chest 2 View 03/11/2013   *RADIOLOGY REPORT*  Clinical Data: Chest pain  CHEST - 2 VIEW  Comparison: Chest radiograph March 07, 2013 and chest CT March 07, 2013.  Findings: There is bullous disease in the right base with what appears to be some new thickening in this area.  There is mild scarring in the left base.  Elsewhere, lungs clear.  There is underlying emphysema.  Heart size is upper normal.  Pulmonary vascular reflects underlying emphysema.  No adenopathy.  There is postoperative change in the lower cervical spine.  IMPRESSION: Underlying emphysema.  There is a bulla in the right base with what may be a new thickening in this area.  Given apparent change compared to recent prior studies, correlation with noncontrast enhanced chest CT to further evaluate this region may be advisable.  There are is no well-defined consolidation on  this study.   Original Report Authenticated By: Bretta Bang, M.D.   Ct Chest Wo Contrast 03/11/2013   CLINICAL DATA:  Chest pain.  Followup chest x-ray.  EXAM: CT CHEST WITHOUT CONTRAST  TECHNIQUE: Multidetector CT imaging of the chest was performed following the standard protocol without IV contrast.  COMPARISON:  CHEST x-ray 03/11/2013  FINDINGS: Bulla is again noted in the right lung base. There is adjacent atelectasis. Linear atelectasis in both lung bases. No pleural effusions.  Insert Heart No mediastinal, hilar, or axillary adenopathy. Visualized thyroid and chest wall soft tissues unremarkable. Imaging into the upper abdomen shows no acute findings.  No acute bony abnormality.  IMPRESSION: Stable bulla in the right lung base. Adjacent atelectasis causing the thickening on plain film.   Electronically Signed   By: Charlett Nose M.D.   On: 03/11/2013 17:46   Ct Angio Chest W/cm &/or Wo Cm 03/07/2013   *RADIOLOGY REPORT*  Clinical Data: Chest pain, former smoker,  history tracheostomy, hypertension  CT ANGIOGRAPHY CHEST  Technique:  Multidetector CT imaging of the chest using the standard protocol during bolus administration of intravenous contrast. Multiplanar reconstructed images including MIPs were obtained and reviewed to evaluate the vascular anatomy.  Contrast: OMNIPAQUE IOHEXOL 350 MG/ML SOLN  Comparison: None Correlation:  CT abdomen and pelvis 04/13/2008  Findings: Mild scattered atherosclerotic calcification aorta. Aorta normal caliber without aneurysm or dissection. Pulmonary arteries well opacified and patent. No evidence of pulmonary embolism. No thoracic adenopathy. Scattered normal-sized mediastinal or axillary lymph nodes. Nonspecific minimally enlarged right caval lymph node 19 x 12 mm image 93, measures 17 x 11 mm on 04/13/2008. Visualized portion of upper abdomen otherwise unremarkable.  Prior cervical spine fusion. Dependent atelectasis in both lungs. Large bleb or pneumatocele at right lung base, 3.7 cm diameter. Mild scattered peribronchial thickening and respiratory motion artifacts. No definite acute infiltrate, pleural effusion, or pneumothorax. No acute osseous findings.  IMPRESSION: No evidence of pulmonary embolism. Bibasilar atelectasis. Small bleb or pneumatocele at right lung base. Nonspecific minimally enlarged portocaval lymph node, only minimally larger than seen in 2009.   Original Report Authenticated By: Ulyses Southward, M.D.   US Abdomen Complete 03/11/2013   *RADIOLOGY REPORT*  Clinical Data:  Abdominal pain.  COMPLETE ABDOMINAL ULTRASOUND  Comparison:  CT of the abdomen and pelvis on 04/13/2008  Findings:  Gallbladder:  Gallbladder has a normal appearance.  Gallbladder wall is 1.6 mm, within normal limits.  No stones or pericholecystic fluid.  No sonographic Murphy's sign.  Common bile duct:  2.8 mm, normal in appearance.  Liver:  No focal lesion identified.  Within normal limits in parenchymal echogenicity.  IVC:  Appears normal.   Pancreas:  No focal abnormality seen.  Spleen:  8.1 cm, normal in appearance.  Right Kidney:  10.9 cm, normal in appearance.  Left Kidney:  10.1 cm, normal in appearance.  Abdominal aorta:  No aneurysm identified.  IMPRESSION: Negative abdominal ultrasound.   Original Report Authenticated By: Norva Pavlov, M.D.    1815:  Pt has climbed off the stretcher by herself and ambulated around the ED with steady gait, easy resps. VS remain stable. Doubt PE as cause for symptoms with normal CT-A chest.  Doubt ACS as cause for symptoms with normal troponin and unchanged EKG from previous after 4 to 5 days of constant symptoms. No acute upper abd pathology to account for pain. Appears msk pain, will tx symptomatically. Dx and extensive dx testing results from 2 ED visits for the same  complaints d/w pt and family.  Questions answered.  Verb understanding, agreeable to d/c home with outpt f/u.       Laray Anger, DO 03/14/13 1428

## 2013-05-20 ENCOUNTER — Other Ambulatory Visit (HOSPITAL_COMMUNITY): Payer: Self-pay | Admitting: Internal Medicine

## 2013-05-20 DIAGNOSIS — Z139 Encounter for screening, unspecified: Secondary | ICD-10-CM

## 2013-06-15 ENCOUNTER — Ambulatory Visit (HOSPITAL_COMMUNITY): Payer: BC Managed Care – PPO

## 2013-06-18 ENCOUNTER — Ambulatory Visit (HOSPITAL_COMMUNITY)
Admission: RE | Admit: 2013-06-18 | Discharge: 2013-06-18 | Disposition: A | Discharge status: Home / Self Care | Payer: BC Managed Care – PPO | Source: Ambulatory Visit | Attending: Internal Medicine | Admitting: Internal Medicine

## 2013-06-18 DIAGNOSIS — Z1231 Encounter for screening mammogram for malignant neoplasm of breast: Secondary | ICD-10-CM | POA: Insufficient documentation

## 2013-06-18 DIAGNOSIS — Z139 Encounter for screening, unspecified: Secondary | ICD-10-CM

## 2015-10-04 DIAGNOSIS — G4733 Obstructive sleep apnea (adult) (pediatric): Secondary | ICD-10-CM

## 2015-10-04 DIAGNOSIS — M797 Fibromyalgia: Secondary | ICD-10-CM

## 2015-10-04 HISTORY — DX: Obstructive sleep apnea (adult) (pediatric): G47.33

## 2015-10-04 HISTORY — DX: Fibromyalgia: M79.7

## 2015-11-23 ENCOUNTER — Ambulatory Visit (INDEPENDENT_AMBULATORY_CARE_PROVIDER_SITE_OTHER): Payer: BLUE CROSS/BLUE SHIELD | Admitting: Cardiology

## 2015-11-23 ENCOUNTER — Encounter: Payer: Self-pay | Admitting: Cardiology

## 2015-11-23 ENCOUNTER — Encounter: Payer: Self-pay | Admitting: *Deleted

## 2015-11-23 VITALS — BP 125/71 | HR 59 | Ht 72.0 in | Wt 175.4 lb

## 2015-11-23 DIAGNOSIS — R079 Chest pain, unspecified: Secondary | ICD-10-CM | POA: Diagnosis not present

## 2015-11-23 NOTE — Progress Notes (Signed)
Patient ID: ASHAYA BAGBY, female   DOB: [birth year], 65 y.o.   MRN: [id number]     Clinical Summary Ms. Alarid is a 65 y.o.female seen today as a new patient, she is referred by Dr Zada Girt. She was seen by Dr Ron Parker several years ago.  1. Chest pain - long history of chest pain symptoms - from Dr Ron Parker old clinic notes seen in 2011 for chest pain, negative workup by echo and pharmacological stress.  - reports recent chest pain episodes starting yesterday. Sharp pain midchest 5/10 in severity with associated SOB, +diaphoresis. Has had some intermittent palpitations on and off since that time.   Past Medical History  Diagnosis Date  . Depression   . Dyslipidemia   . PONV (postoperative nausea and vomiting)   . Heartburn     occasional - OTC as needed  . History of GI bleed   . Hypertension     under control, has been on med. x 5-6 yrs.  . Wears partial dentures     upper and lower  . Dental crowns present   . Blister of heel 05/2012    fracture blisters right heel  . Calcaneus fracture, right 04/29/2012  . Chronic back pain   . Fibromyalgia   . Diabetes mellitus without complication ([place])   . Fibromyalgia 10/04/2015  . OSA (obstructive sleep apnea) 10/04/2015     Allergies  Allergen Reactions  . Aspirin Nausea And Vomiting  . Nsaids Other (See Comments)    GI BLEED     Current Outpatient Prescriptions  Medication Sig Dispense Refill  . amLODipine (NORVASC) 10 MG tablet Take 10 mg by mouth at bedtime.     Marland Kitchen aspirin 81 MG tablet Take 81 mg by mouth every morning.     Marland Kitchen atorvastatin (LIPITOR) 20 MG tablet Take 20 mg by mouth daily.    . cholecalciferol (VITAMIN D) 1000 UNITS tablet Take 1,000 Units by mouth 2 (two) times daily.     . cyclobenzaprine (FLEXERIL) 5 MG tablet Take 5 mg by mouth 2 (two) times daily as needed for muscle spasms.    . fish oil-omega-3 fatty acids 1000 MG capsule Take 1,500 mg by mouth daily.     Marland Kitchen FLUoxetine (PROZAC) 20 MG capsule Take 20 mg  by mouth daily.    Marland Kitchen gemfibrozil (LOPID) 600 MG tablet Take 600 mg by mouth 2 (two) times daily before a meal.    . glucosamine-chondroitin 500-400 MG tablet Take 1 tablet by mouth every morning.     Marland Kitchen HYDROcodone-acetaminophen (NORCO/VICODIN) 5-325 MG per tablet Take 1 tablet by mouth every 6 (six) hours as needed for pain.    Marland Kitchen KRILL OIL PO Take 1 capsule by mouth 2 (two) times daily.    Marland Kitchen losartan-hydrochlorothiazide (HYZAAR) 100-25 MG per tablet Take 1 tablet by mouth every morning.     . metFORMIN (GLUCOPHAGE-XR) 500 MG 24 hr tablet Take 500 mg by mouth daily with breakfast.    . methocarbamol (ROBAXIN) 500 MG tablet Take 2 tablets (1,000 mg total) by mouth 4 (four) times daily as needed (muscle spasm/pain). 25 tablet 0  . Multiple Vitamins-Minerals (MULTIVITAMIN PO) Take 1 tablet by mouth every morning.     Marland Kitchen oxyCODONE-acetaminophen (PERCOCET/ROXICET) 5-325 MG per tablet 1 or 2 tabs PO q6h prn pain 20 tablet 0  . Probiotic Product (PROBIOTIC FORMULA PO) Take 1 tablet by mouth every morning.    . sulfamethoxazole-trimethoprim (BACTRIM DS,SEPTRA DS) 800-160 MG tablet Take 1 tablet by  mouth 2 (two) times daily.    . valACYclovir (VALTREX) 1000 MG tablet Take 1,000 mg by mouth 2 (two) times daily.    . vitamin B-12 (CYANOCOBALAMIN) 1000 MCG tablet Take 1,000 mcg by mouth once a week.      No current facility-administered medications for this visit.     Past Surgical History  Procedure Laterality Date  . Posterior lumbar fusion  10/04/2004  . Appendectomy    . Foot surgery      right  . Anterior cervical discectomy  07/18/1998    C5-6, C6-7 with arthrodesis  . Abdominal hysterectomy      partial  . Tracheostomy    . Tympanoplasty    . Bilateral oophorectomy      with bilat. salpingectomy  . Orif calcaneous fracture  05/14/2012    Procedure: OPEN REDUCTION INTERNAL FIXATION (ORIF) CALCANEOUS FRACTURE;  Surgeon: Jolyn Nap, MD;  Location: [place];  Service:  Orthopedics;  Laterality: Right;     Allergies  Allergen Reactions  . Aspirin Nausea And Vomiting  . Nsaids Other (See Comments)    GI BLEED      Family History  Problem Relation Age of Onset  . Prostate cancer Father   . Colon cancer Mother      Social History Ms. Borchers reports that she has quit smoking. She has never used smokeless tobacco. Ms. Huller reports that she does not drink alcohol.   Review of Systems CONSTITUTIONAL: No weight loss, fever, chills, weakness or fatigue.  HEENT: Eyes: No visual loss, blurred vision, double vision or yellow sclerae.No hearing loss, sneezing, congestion, runny nose or sore throat.  SKIN: No rash or itching.  CARDIOVASCULAR: per HPI RESPIRATORY: No shortness of breath, cough or sputum.  GASTROINTESTINAL: No anorexia, nausea, vomiting or diarrhea. No abdominal pain or blood.  GENITOURINARY: No burning on urination, no polyuria NEUROLOGICAL: No headache, dizziness, syncope, paralysis, ataxia, numbness or tingling in the extremities. No change in bowel or bladder control.  MUSCULOSKELETAL: No muscle, back pain, joint pain or stiffness.  LYMPHATICS: No enlarged nodes. No history of splenectomy.  PSYCHIATRIC: No history of depression or anxiety.  ENDOCRINOLOGIC: No reports of sweating, cold or heat intolerance. No polyuria or polydipsia.  Marland Kitchen   Physical Examination Filed Vitals:   11/23/15 1303  BP: 125/71  Pulse: 59   Filed Vitals:   11/23/15 1303  Height: 6' (1.829 m)  Weight: 175 lb 6.4 oz (79.561 kg)    Gen: resting comfortably, no acute distress HEENT: no scleral icterus, pupils equal round and reactive, no palptable cervical adenopathy,  CV: RRR, no m/r/g, no jvd Resp: Clear to auscultation bilaterally GI: abdomen is soft, non-tender, non-distended, normal bowel sounds, no hepatosplenomegaly MSK: extremities are warm, no edema.  Skin: warm, no rash Neuro:  no focal deficits Psych: appropriate  affect      Assessment and Plan   1. Chest pain - unclear etiology EKG in clinic shows SR without ischemic changes - symptoms are concerning. I discussed the patient with ER physician at [facility], patient is to go to ER for evaluation for acute chest pain.     Arnoldo Lenis, M.D.

## 2015-11-23 NOTE — Patient Instructions (Signed)
Your physician recommends that you schedule a follow-up appointment in: [place] DR. BRANCH  PLEASE REPORT TO [facility] EMERGENCY ROOM FOR EVALUATION  WE WILL CONTACT THE ATTENDING PHYSICIAN   Thank you for choosing [facility]!!

## 2015-12-29 ENCOUNTER — Ambulatory Visit: Payer: BLUE CROSS/BLUE SHIELD | Admitting: Cardiology

## 2016-01-31 ENCOUNTER — Encounter (HOSPITAL_COMMUNITY): Payer: Self-pay | Admitting: Emergency Medicine

## 2016-01-31 ENCOUNTER — Emergency Department (HOSPITAL_COMMUNITY): Payer: BLUE CROSS/BLUE SHIELD

## 2016-01-31 ENCOUNTER — Emergency Department (HOSPITAL_COMMUNITY)
Admission: EM | Admit: 2016-01-31 | Discharge: 2016-01-31 | Disposition: A | Discharge status: Home / Self Care | Payer: BLUE CROSS/BLUE SHIELD | Attending: Emergency Medicine | Admitting: Emergency Medicine

## 2016-01-31 DIAGNOSIS — S81011A Laceration without foreign body, right knee, initial encounter: Secondary | ICD-10-CM | POA: Diagnosis not present

## 2016-01-31 DIAGNOSIS — Z7984 Long term (current) use of oral hypoglycemic drugs: Secondary | ICD-10-CM | POA: Insufficient documentation

## 2016-01-31 DIAGNOSIS — W010XXA Fall on same level from slipping, tripping and stumbling without subsequent striking against object, initial encounter: Secondary | ICD-10-CM | POA: Diagnosis not present

## 2016-01-31 DIAGNOSIS — M542 Cervicalgia: Secondary | ICD-10-CM | POA: Insufficient documentation

## 2016-01-31 DIAGNOSIS — Y939 Activity, unspecified: Secondary | ICD-10-CM | POA: Insufficient documentation

## 2016-01-31 DIAGNOSIS — Y999 Unspecified external cause status: Secondary | ICD-10-CM | POA: Insufficient documentation

## 2016-01-31 DIAGNOSIS — Z79899 Other long term (current) drug therapy: Secondary | ICD-10-CM | POA: Insufficient documentation

## 2016-01-31 DIAGNOSIS — E119 Type 2 diabetes mellitus without complications: Secondary | ICD-10-CM | POA: Insufficient documentation

## 2016-01-31 DIAGNOSIS — Y929 Unspecified place or not applicable: Secondary | ICD-10-CM | POA: Diagnosis not present

## 2016-01-31 DIAGNOSIS — I10 Essential (primary) hypertension: Secondary | ICD-10-CM | POA: Insufficient documentation

## 2016-01-31 DIAGNOSIS — Z87891 Personal history of nicotine dependence: Secondary | ICD-10-CM | POA: Insufficient documentation

## 2016-01-31 DIAGNOSIS — Z7982 Long term (current) use of aspirin: Secondary | ICD-10-CM | POA: Insufficient documentation

## 2016-01-31 DIAGNOSIS — W19XXXA Unspecified fall, initial encounter: Secondary | ICD-10-CM

## 2016-01-31 MED ORDER — HYDROCODONE-ACETAMINOPHEN 5-325 MG PO TABS
1.0000 | ORAL_TABLET | Freq: Once | ORAL | Status: AC
  Administered 2016-01-31: 1 via ORAL
  Filled 2016-01-31: qty 1

## 2016-01-31 MED ORDER — TRAMADOL HCL 50 MG PO TABS: 50.0000 mg | ORAL_TABLET | Freq: Four times a day (QID) | ORAL | 0 refills | Status: DC | PRN

## 2016-01-31 MED ORDER — TETANUS-DIPHTH-ACELL PERTUSSIS 5-2.5-18.5 LF-MCG/0.5 IM SUSP
0.5000 mL | Freq: Once | INTRAMUSCULAR | Status: DC
  Filled 2016-01-31: qty 0.5

## 2016-01-31 MED ORDER — CEPHALEXIN 500 MG PO CAPS: 500.0000 mg | ORAL_CAPSULE | Freq: Four times a day (QID) | ORAL | 0 refills | Status: DC

## 2016-01-31 NOTE — ED Provider Notes (Signed)
[facility] DEPT Provider Note   CSN: [id number] Arrival date & time: 01/31/16  1221  First Provider Contact:  None     By signing my name below, I, Cassandra Mccullough, attest that this documentation has been prepared under the direction and in the presence of Cassandra Ferguson, MD.  Electronically Signed: Julien Mccullough, ED Scribe. 01/31/16. 12:48 PM.   History   Chief Complaint Chief Complaint  Patient presents with  . Fall     Patient tripped and fell yesterday. Patient complains of pain in her right lower leg with a laceration. She also had some neck pain   The history is provided by the patient. No language interpreter was used.  Fall  This is a new problem. The current episode started yesterday. The problem occurs rarely. The problem has been resolved. Pertinent negatives include no chest pain, no abdominal pain and no headaches. Exacerbated by: Movement right lower leg. Nothing relieves the symptoms. She has tried nothing for the symptoms.   HPI Comments: Cassandra Mccullough is a 65 y.o. female who presents to the Emergency Department presenting after a fall that occurred yesterday afternoon. She complains of neck pain and generalized myalgias from the fall. Pt states she had her hands full when she went to step over a lawn mower, tripped, and fell. Pt had a laceration on her right knee with bleeding currently controlled. She has not taken any medication to alleviate her pain. Pt did not hit her head or lose consciousness. She denies any other complaints.   Past Medical History:  Diagnosis Date  . Blister of heel 05/2012   fracture blisters right heel  . Calcaneus fracture, right 04/29/2012  . Chronic back pain   . Dental crowns present   . Depression   . Diabetes mellitus without complication ([place])   . Dyslipidemia   . Fibromyalgia   . Fibromyalgia 10/04/2015  . Heartburn    occasional - OTC as needed  . History of GI bleed   . Hypertension    under control, has been on  med. x 5-6 yrs.  . OSA (obstructive sleep apnea) 10/04/2015  . PONV (postoperative nausea and vomiting)   . Wears partial dentures    upper and lower    Patient Active Problem List   Diagnosis Date Noted  . SHORTNESS OF BREATH 04/04/2010  . CHEST PAIN 04/04/2010  . DYSLIPIDEMIA 04/03/2010  . HYPERTENSION 04/03/2010  . BRADYCARDIA 04/03/2010    Past Surgical History:  Procedure Laterality Date  . ABDOMINAL HYSTERECTOMY     partial  . ANTERIOR CERVICAL DISCECTOMY  07/18/1998   C5-6, C6-7 with arthrodesis  . APPENDECTOMY    . BILATERAL OOPHORECTOMY     with bilat. salpingectomy  . FOOT SURGERY     right  . ORIF CALCANEOUS FRACTURE  05/14/2012   Procedure: OPEN REDUCTION INTERNAL FIXATION (ORIF) CALCANEOUS FRACTURE;  Surgeon: Jolyn Nap, MD;  Location: [facility];  Service: Orthopedics;  Laterality: Right;  . POSTERIOR LUMBAR FUSION  10/04/2004  . TRACHEOSTOMY    . TYMPANOPLASTY      OB History    No data available       Home Medications    Prior to Admission medications   Medication Sig Start Date End Date Taking? Authorizing Provider  amLODipine (NORVASC) 10 MG tablet Take 10 mg by mouth at bedtime.     Historical Provider, MD  aspirin 81 MG tablet Take 81 mg by mouth every morning.  Historical Provider, MD  cholecalciferol (VITAMIN D) 1000 UNITS tablet Take 1,000 Units by mouth 2 (two) times daily.     Historical Provider, MD  fish oil-omega-3 fatty acids 1000 MG capsule Take 1,500 mg by mouth daily.     Historical Provider, MD  FLUoxetine (PROZAC) 20 MG capsule Take 60 mg by mouth daily.     Historical Provider, MD  glucosamine-chondroitin 500-400 MG tablet Take 1 tablet by mouth every morning.     Historical Provider, MD  losartan-hydrochlorothiazide (HYZAAR) 100-25 MG per tablet Take 1 tablet by mouth every morning.     Historical Provider, MD  metFORMIN (GLUCOPHAGE-XR) 500 MG 24 hr tablet Take 500 mg by mouth daily with breakfast.    Historical  Provider, MD  methocarbamol (ROBAXIN) 500 MG tablet Take 2 tablets (1,000 mg total) by mouth 4 (four) times daily as needed (muscle spasm/pain). 03/11/13   Francine Graven, DO    Family History Family History  Problem Relation Age of Onset  . Prostate cancer Father   . Colon cancer Mother     Social History Social History  Substance Use Topics  . Smoking status: Former Research scientist (life sciences)  . Smokeless tobacco: Never Used     Comment: quit smoking > 10 yrs. ago  . Alcohol use No     Allergies   Aspirin and Nsaids   Review of Systems Review of Systems  Constitutional: Negative for appetite change and fatigue.  HENT: Negative for congestion, ear discharge and sinus pressure.   Eyes: Negative for discharge.  Respiratory: Negative for cough.   Cardiovascular: Negative for chest pain.  Gastrointestinal: Negative for abdominal pain and diarrhea.  Genitourinary: Negative for frequency and hematuria.  Musculoskeletal: Negative for back pain.  Skin: Positive for wound. Negative for rash.  Neurological: Negative for seizures and headaches.  Psychiatric/Behavioral: Negative for hallucinations.     Physical Exam Updated Vital Signs BP 136/73 (BP Location: Left Arm)   Pulse (!) 58   Temp 97.9 F (36.6 C) (Oral)   Resp 16   Ht 6' (1.829 m)   Wt 174 lb (78.9 kg)   SpO2 99%   BMI 23.60 kg/m   Physical Exam  Constitutional: She is oriented to person, place, and time. She appears well-developed.  HENT:  Head: Normocephalic.  Eyes: Conjunctivae and EOM are normal. No scleral icterus.  Neck: Neck supple. No thyromegaly present.  Mild tenderness to posterior neck  Cardiovascular: Normal rate and regular rhythm.  Exam reveals no gallop and no friction rub.   No murmur heard. Pulmonary/Chest: No stridor. She has no wheezes. She has no rales. She exhibits no tenderness.  Abdominal: She exhibits no distension. There is no tenderness. There is no rebound.  Musculoskeletal: Normal range of  motion. She exhibits no edema.  Mild bruising to right lower extremity with a 2 cm laceration on the right knee  Lymphadenopathy:    She has no cervical adenopathy.  Neurological: She is oriented to person, place, and time. She exhibits normal muscle tone. Coordination normal.  Skin: No rash noted. No erythema.  Psychiatric: She has a normal mood and affect. Her behavior is normal.  Nursing note and vitals reviewed.    ED Treatments / Results  DIAGNOSTIC STUDIES: Oxygen Saturation is 99% on RA, normal by my interpretation.  COORDINATION OF CARE: 12:47 PM Discussed treatment plan with pt at bedside and pt agreed to plan.  Labs (all labs ordered are listed, but only abnormal results are displayed) Labs Reviewed - No  data to display  EKG  EKG Interpretation None       Radiology No results found.  Procedures Procedures (including critical care time)  Medications Ordered in ED Medications - No data to display   Initial Impression / Assessment and Plan / ED Course  I have reviewed the triage vital signs and the nursing notes.  Pertinent labs & imaging results that were available during my care of the patient were reviewed by me and considered in my medical decision making (see chart for details).  Clinical Course  Fall with contusion to right lower leg and laceration. The laceration will be cleaned thoroughly will not be sutured because of several 24 hours old. Patient will be put on antibiotics and Ultram. Patient refused the x-ray of her neck she'll follow-up with her PCP    Final Clinical Impressions(s) / ED Diagnoses   Final diagnoses:  None   The chart was scribed for me under my direct supervision.  I personally performed the history, physical, and medical decision making and all procedures in the evaluation of this patient..  New Prescriptions New Prescriptions   No medications on file     Cassandra Ferguson, MD 01/31/16 [phone]

## 2016-01-31 NOTE — Discharge Instructions (Signed)
Clean laceration twice a day with soap and water gently. Follow-up with your doctor next week for recheck

## 2016-01-31 NOTE — ED Triage Notes (Signed)
Pt reports falling yesterday after tripping, has injury to right leg, and pt did fall on face, pt c/o neck pain and upper back pain.  Pt unable to walk or put pressure on right leg. Pt has area bandaged below knee on right leg. Pt denies LOC, pt not on blood thinners.  Pt alert and oriented.

## 2016-01-31 NOTE — ED Notes (Signed)
PT stated she had Tdap vaccine 2014. Medication order d/ced.

## 2016-06-14 DIAGNOSIS — M797 Fibromyalgia: Secondary | ICD-10-CM | POA: Diagnosis not present

## 2016-06-14 DIAGNOSIS — F3289 Other specified depressive episodes: Secondary | ICD-10-CM | POA: Diagnosis not present

## 2016-06-14 DIAGNOSIS — I10 Essential (primary) hypertension: Secondary | ICD-10-CM | POA: Diagnosis not present

## 2016-06-14 DIAGNOSIS — E1165 Type 2 diabetes mellitus with hyperglycemia: Secondary | ICD-10-CM | POA: Diagnosis not present

## 2016-08-10 DIAGNOSIS — Z23 Encounter for immunization: Secondary | ICD-10-CM | POA: Diagnosis not present

## 2016-09-13 DIAGNOSIS — M9905 Segmental and somatic dysfunction of pelvic region: Secondary | ICD-10-CM | POA: Diagnosis not present

## 2016-09-13 DIAGNOSIS — M545 Low back pain: Secondary | ICD-10-CM | POA: Diagnosis not present

## 2016-09-13 DIAGNOSIS — M9903 Segmental and somatic dysfunction of lumbar region: Secondary | ICD-10-CM | POA: Diagnosis not present

## 2016-09-13 DIAGNOSIS — M9904 Segmental and somatic dysfunction of sacral region: Secondary | ICD-10-CM | POA: Diagnosis not present

## 2016-09-14 DIAGNOSIS — M9904 Segmental and somatic dysfunction of sacral region: Secondary | ICD-10-CM | POA: Diagnosis not present

## 2016-09-14 DIAGNOSIS — M9905 Segmental and somatic dysfunction of pelvic region: Secondary | ICD-10-CM | POA: Diagnosis not present

## 2016-09-14 DIAGNOSIS — M9903 Segmental and somatic dysfunction of lumbar region: Secondary | ICD-10-CM | POA: Diagnosis not present

## 2016-09-14 DIAGNOSIS — M545 Low back pain: Secondary | ICD-10-CM | POA: Diagnosis not present

## 2016-09-17 DIAGNOSIS — M545 Low back pain: Secondary | ICD-10-CM | POA: Diagnosis not present

## 2016-09-17 DIAGNOSIS — M9905 Segmental and somatic dysfunction of pelvic region: Secondary | ICD-10-CM | POA: Diagnosis not present

## 2016-09-17 DIAGNOSIS — M9903 Segmental and somatic dysfunction of lumbar region: Secondary | ICD-10-CM | POA: Diagnosis not present

## 2016-09-17 DIAGNOSIS — M9904 Segmental and somatic dysfunction of sacral region: Secondary | ICD-10-CM | POA: Diagnosis not present

## 2016-09-20 DIAGNOSIS — Z0000 Encounter for general adult medical examination without abnormal findings: Secondary | ICD-10-CM | POA: Diagnosis not present

## 2016-09-20 DIAGNOSIS — I10 Essential (primary) hypertension: Secondary | ICD-10-CM | POA: Diagnosis not present

## 2016-09-20 DIAGNOSIS — E1165 Type 2 diabetes mellitus with hyperglycemia: Secondary | ICD-10-CM | POA: Diagnosis not present

## 2016-09-20 DIAGNOSIS — F3289 Other specified depressive episodes: Secondary | ICD-10-CM | POA: Diagnosis not present

## 2016-09-20 DIAGNOSIS — M797 Fibromyalgia: Secondary | ICD-10-CM | POA: Diagnosis not present

## 2016-10-08 DIAGNOSIS — Z1231 Encounter for screening mammogram for malignant neoplasm of breast: Secondary | ICD-10-CM | POA: Diagnosis not present

## 2016-12-13 DIAGNOSIS — I10 Essential (primary) hypertension: Secondary | ICD-10-CM | POA: Diagnosis not present

## 2016-12-13 DIAGNOSIS — I208 Other forms of angina pectoris: Secondary | ICD-10-CM | POA: Diagnosis not present

## 2016-12-13 DIAGNOSIS — F3289 Other specified depressive episodes: Secondary | ICD-10-CM | POA: Diagnosis not present

## 2016-12-13 DIAGNOSIS — E1165 Type 2 diabetes mellitus with hyperglycemia: Secondary | ICD-10-CM | POA: Diagnosis not present

## 2016-12-14 DIAGNOSIS — Z79899 Other long term (current) drug therapy: Secondary | ICD-10-CM | POA: Diagnosis not present

## 2016-12-14 DIAGNOSIS — K219 Gastro-esophageal reflux disease without esophagitis: Secondary | ICD-10-CM | POA: Diagnosis not present

## 2016-12-14 DIAGNOSIS — R109 Unspecified abdominal pain: Secondary | ICD-10-CM | POA: Diagnosis not present

## 2016-12-14 DIAGNOSIS — Z7902 Long term (current) use of antithrombotics/antiplatelets: Secondary | ICD-10-CM | POA: Diagnosis not present

## 2016-12-14 DIAGNOSIS — F329 Major depressive disorder, single episode, unspecified: Secondary | ICD-10-CM | POA: Diagnosis not present

## 2016-12-14 DIAGNOSIS — Z8042 Family history of malignant neoplasm of prostate: Secondary | ICD-10-CM | POA: Diagnosis not present

## 2016-12-14 DIAGNOSIS — B9681 Helicobacter pylori [H. pylori] as the cause of diseases classified elsewhere: Secondary | ICD-10-CM | POA: Diagnosis not present

## 2016-12-14 DIAGNOSIS — R079 Chest pain, unspecified: Secondary | ICD-10-CM | POA: Diagnosis not present

## 2016-12-14 DIAGNOSIS — Z87891 Personal history of nicotine dependence: Secondary | ICD-10-CM | POA: Diagnosis not present

## 2016-12-14 DIAGNOSIS — R112 Nausea with vomiting, unspecified: Secondary | ICD-10-CM | POA: Diagnosis not present

## 2016-12-14 DIAGNOSIS — E119 Type 2 diabetes mellitus without complications: Secondary | ICD-10-CM | POA: Diagnosis not present

## 2016-12-14 DIAGNOSIS — Z7984 Long term (current) use of oral hypoglycemic drugs: Secondary | ICD-10-CM | POA: Diagnosis not present

## 2016-12-14 DIAGNOSIS — I700 Atherosclerosis of aorta: Secondary | ICD-10-CM | POA: Diagnosis not present

## 2016-12-14 DIAGNOSIS — R0789 Other chest pain: Secondary | ICD-10-CM | POA: Diagnosis not present

## 2016-12-14 DIAGNOSIS — Z800 Family history of malignant neoplasm of digestive organs: Secondary | ICD-10-CM | POA: Diagnosis not present

## 2016-12-14 DIAGNOSIS — Z8673 Personal history of transient ischemic attack (TIA), and cerebral infarction without residual deficits: Secondary | ICD-10-CM | POA: Diagnosis not present

## 2016-12-14 DIAGNOSIS — R110 Nausea: Secondary | ICD-10-CM | POA: Diagnosis not present

## 2016-12-14 DIAGNOSIS — Z886 Allergy status to analgesic agent status: Secondary | ICD-10-CM | POA: Diagnosis not present

## 2016-12-14 DIAGNOSIS — I10 Essential (primary) hypertension: Secondary | ICD-10-CM | POA: Diagnosis not present

## 2016-12-15 DIAGNOSIS — B9681 Helicobacter pylori [H. pylori] as the cause of diseases classified elsewhere: Secondary | ICD-10-CM | POA: Diagnosis not present

## 2016-12-15 DIAGNOSIS — I10 Essential (primary) hypertension: Secondary | ICD-10-CM | POA: Diagnosis not present

## 2016-12-15 DIAGNOSIS — R0789 Other chest pain: Secondary | ICD-10-CM | POA: Diagnosis not present

## 2016-12-15 DIAGNOSIS — K219 Gastro-esophageal reflux disease without esophagitis: Secondary | ICD-10-CM | POA: Diagnosis not present

## 2016-12-15 DIAGNOSIS — I700 Atherosclerosis of aorta: Secondary | ICD-10-CM | POA: Diagnosis not present

## 2016-12-15 DIAGNOSIS — E119 Type 2 diabetes mellitus without complications: Secondary | ICD-10-CM | POA: Diagnosis not present

## 2016-12-24 DIAGNOSIS — F3289 Other specified depressive episodes: Secondary | ICD-10-CM | POA: Diagnosis not present

## 2016-12-24 DIAGNOSIS — I10 Essential (primary) hypertension: Secondary | ICD-10-CM | POA: Diagnosis not present

## 2016-12-24 DIAGNOSIS — E1165 Type 2 diabetes mellitus with hyperglycemia: Secondary | ICD-10-CM | POA: Diagnosis not present

## 2016-12-24 DIAGNOSIS — R0789 Other chest pain: Secondary | ICD-10-CM | POA: Diagnosis not present

## 2017-01-15 DIAGNOSIS — H43811 Vitreous degeneration, right eye: Secondary | ICD-10-CM | POA: Diagnosis not present

## 2017-01-17 ENCOUNTER — Encounter: Payer: Self-pay | Admitting: Cardiology

## 2017-01-17 ENCOUNTER — Ambulatory Visit (INDEPENDENT_AMBULATORY_CARE_PROVIDER_SITE_OTHER): Payer: Medicare Other | Admitting: Cardiology

## 2017-01-17 ENCOUNTER — Encounter: Payer: Self-pay | Admitting: *Deleted

## 2017-01-17 VITALS — BP 114/68 | HR 58 | Ht 72.0 in | Wt 174.6 lb

## 2017-01-17 DIAGNOSIS — R079 Chest pain, unspecified: Secondary | ICD-10-CM | POA: Diagnosis not present

## 2017-01-17 NOTE — Progress Notes (Addendum)
Clinical Summary Cassandra Mccullough is a 66 y.o.female seen today for follow up of the following medical problems.   1. Chest pain - long history of chest pain symptoms - from Dr Ron Parker old clinic notes seen in 2011 for chest pain, negative workup by echo and pharmacological stress.  - reports recent chest pain episodes starting yesterday. Sharp pain midchest 5/10 in severity with associated SOB, +diaphoresis. Has had some intermittent palpitations on and off since that time.   - 11/2016 ER visit to [facility] with chest pain. Negative workup for ACS, thought to be GI related.  - CXR did show aortic atherosclerosis.   - sharp like pain, left chest. 6-8/10 in severity. Can occur with activity. Can have some SOB with it, also with increased DOE. Pain will last 5-15 minutes. Unsure if positional  - total of 6-7 episodes over the last 2 months.  CAD risk factors: diet controlled DM2, HTN, former tobacco, brothers x 2 with CABG.  - stress test last year, she reports it was normal.    Past Medical History:  Diagnosis Date  . Blister of heel 05/2012   fracture blisters right heel  . Calcaneus fracture, right 04/29/2012  . Chronic back pain   . Dental crowns present   . Depression   . Diabetes mellitus without complication ([place])   . Dyslipidemia   . Fibromyalgia   . Fibromyalgia 10/04/2015  . Heartburn    occasional - OTC as needed  . History of GI bleed   . Hypertension    under control, has been on med. x 5-6 yrs.  . OSA (obstructive sleep apnea) 10/04/2015  . PONV (postoperative nausea and vomiting)   . Wears partial dentures    upper and lower     Allergies  Allergen Reactions  . Aspirin Nausea And Vomiting  . Nsaids Other (See Comments)    GI BLEED     Current Outpatient Prescriptions  Medication Sig Dispense Refill  . amLODipine (NORVASC) 10 MG tablet Take 10 mg by mouth at bedtime.     Marland Kitchen aspirin 81 MG tablet Take 81 mg by mouth every morning.     . cephALEXin (KEFLEX)  500 MG capsule Take 1 capsule (500 mg total) by mouth 4 (four) times daily. 28 capsule 0  . cholecalciferol (VITAMIN D) 1000 UNITS tablet Take 1,000 Units by mouth 2 (two) times daily.     . fish oil-omega-3 fatty acids 1000 MG capsule Take 1,500 mg by mouth daily.     Marland Kitchen FLUoxetine (PROZAC) 20 MG capsule Take 60 mg by mouth daily.     Marland Kitchen losartan-hydrochlorothiazide (HYZAAR) 100-25 MG per tablet Take 1 tablet by mouth every morning.     . metFORMIN (GLUCOPHAGE-XR) 500 MG 24 hr tablet Take 500 mg by mouth daily with breakfast.    . traMADol (ULTRAM) 50 MG tablet Take 1 tablet (50 mg total) by mouth every 6 (six) hours as needed. 20 tablet 0   No current facility-administered medications for this visit.      Past Surgical History:  Procedure Laterality Date  . ABDOMINAL HYSTERECTOMY     partial  . ANTERIOR CERVICAL DISCECTOMY  07/18/1998   C5-6, C6-7 with arthrodesis  . APPENDECTOMY    . BILATERAL OOPHORECTOMY     with bilat. salpingectomy  . FOOT SURGERY     right  . ORIF CALCANEOUS FRACTURE  05/14/2012   Procedure: OPEN REDUCTION INTERNAL FIXATION (ORIF) CALCANEOUS FRACTURE;  Surgeon: Rayvon Char  Grandville Silos, MD;  Location: [facility];  Service: Orthopedics;  Laterality: Right;  . POSTERIOR LUMBAR FUSION  10/04/2004  . TRACHEOSTOMY    . TYMPANOPLASTY       Allergies  Allergen Reactions  . Aspirin Nausea And Vomiting  . Nsaids Other (See Comments)    GI BLEED      Family History  Problem Relation Age of Onset  . Prostate cancer Father   . Colon cancer Mother      Social History Cassandra Mccullough reports that she has quit smoking. She has never used smokeless tobacco. Cassandra Mccullough reports that she does not drink alcohol.   Review of Systems CONSTITUTIONAL: No weight loss, fever, chills, weakness or fatigue.  HEENT: Eyes: No visual loss, blurred vision, double vision or yellow sclerae.No hearing loss, sneezing, congestion, runny nose or sore throat.  SKIN: No rash  or itching.  CARDIOVASCULAR: per hpi RESPIRATORY: No shortness of breath, cough or sputum.  GASTROINTESTINAL: No anorexia, nausea, vomiting or diarrhea. No abdominal pain or blood.  GENITOURINARY: No burning on urination, no polyuria NEUROLOGICAL: No headache, dizziness, syncope, paralysis, ataxia, numbness or tingling in the extremities. No change in bowel or bladder control.  MUSCULOSKELETAL: No muscle, back pain, joint pain or stiffness.  LYMPHATICS: No enlarged nodes. No history of splenectomy.  PSYCHIATRIC: No history of depression or anxiety.  ENDOCRINOLOGIC: No reports of sweating, cold or heat intolerance. No polyuria or polydipsia.  Marland Kitchen   Physical Examination Vitals:   01/17/17 1446 01/17/17 1453  BP: 118/70 114/68  Pulse: (!) 58    Vitals:   01/17/17 1446  Weight: 174 lb 9.6 oz (79.2 kg)  Height: 6' (1.829 m)    Gen: resting comfortably, no acute distress HEENT: no scleral icterus, pupils equal round and reactive, no palptable cervical adenopathy,  CV: RRR, no m/r/g, no jvd Resp: Clear to auscultation bilaterally GI: abdomen is soft, non-tender, non-distended, normal bowel sounds, no hepatosplenomegaly MSK: extremities are warm, no edema.  Skin: warm, no rash Neuro:  no focal deficits Psych: appropriate affect     Assessment and Plan  1. Chest pain - unclear etiology - we will obtain echo to evaluate cardiac function. Request results of prior stress test - pending echo and prior stress results, consider repeat ischemic testing either invasive or noninvasive   F/u pending test results  Addendum 01/22/17 12/2015 Lexiscan results received from [place], no evidence of ishcemia.     Arnoldo Lenis, M.D., F.A.C.C.

## 2017-01-17 NOTE — Patient Instructions (Signed)
Your physician recommends that you schedule a follow-up appointment in: TO BE DETERMINED WITH DR BRANCH  Your physician recommends that you continue on your current medications as directed. Please refer to the Current Medication list given to you today.  Your physician has requested that you have an echocardiogram. Echocardiography is a painless test that uses sound waves to create images of your heart. It provides your doctor with information about the size and shape of your heart and how well your heart's chambers and valves are working. This procedure takes approximately one hour. There are no restrictions for this procedure.  Thank you for choosing [facility]!!    

## 2017-01-18 ENCOUNTER — Ambulatory Visit (HOSPITAL_COMMUNITY)
Admission: RE | Admit: 2017-01-18 | Discharge: 2017-01-18 | Disposition: A | Discharge status: Home / Self Care | Payer: Medicare Other | Source: Ambulatory Visit | Attending: Cardiology | Admitting: Cardiology

## 2017-01-18 DIAGNOSIS — I071 Rheumatic tricuspid insufficiency: Secondary | ICD-10-CM | POA: Insufficient documentation

## 2017-01-18 DIAGNOSIS — R079 Chest pain, unspecified: Secondary | ICD-10-CM | POA: Diagnosis not present

## 2017-01-18 NOTE — Progress Notes (Signed)
  Echocardiogram 2D Echocardiogram has been performed.  Encarnacion Scioneaux T Candise Crabtree 01/18/2017, 2:27 PM

## 2017-01-21 ENCOUNTER — Telehealth: Payer: Self-pay | Admitting: Cardiology

## 2017-01-21 NOTE — Telephone Encounter (Signed)
Patient notified we have not received results yet, but will call as soon as we do

## 2017-01-21 NOTE — Telephone Encounter (Signed)
Patient called in regards to a recent echo.

## 2017-01-22 NOTE — Telephone Encounter (Signed)
Echo looks good, she has normal heart function. Can we touch base with [facility] again. She had a nuclear stress test 12/06/2015, we have only the EKG portion but do not have the interpretation report from the imaging. Let her know that depending on this we will decide what further testing is needed   J BrancH MD

## 2017-01-22 NOTE — Telephone Encounter (Signed)
Patient notified. Routed to PCP 

## 2017-01-22 NOTE — Telephone Encounter (Signed)
LMTCB

## 2017-01-25 ENCOUNTER — Telehealth: Payer: Self-pay | Admitting: *Deleted

## 2017-01-25 MED ORDER — PANTOPRAZOLE SODIUM 40 MG PO TBEC: 40.0000 mg | DELAYED_RELEASE_TABLET | Freq: Every day | ORAL | 1 refills | Status: DC

## 2017-01-25 NOTE — Telephone Encounter (Signed)
LM to return call.

## 2017-01-25 NOTE — Telephone Encounter (Signed)
Pt aware says she doesn't understand why she is still having SOB/chest pain is everything is reassuring - says she would try the protonix but was concerned about waiting 6 weeks for f/u - offered earlier appt with extender but pt declined said she would try the protonix.

## 2017-01-25 NOTE — Telephone Encounter (Signed)
Have her call us next week to update [place] on symptoms. We don't neccesarily have to wait 6 weeks to change our course of action   [name] MD

## 2017-01-25 NOTE — Telephone Encounter (Signed)
-----   Message from Arnoldo Lenis, MD sent at 01/24/2017  2:10 PM EDT ----- Please let patient know we received her stress test from [facility] from last year which was normal. We may need to consider a cath at some point to defintively rule out blockages, but the information thus far has been reassuring. I'd like to try her on protonix 40mg  daily to see if there may be a possible GI cause of her symptoms, follow in 6 weeks to reevalaute symptoms  [name] MD

## 2017-01-28 ENCOUNTER — Encounter: Payer: Self-pay | Admitting: *Deleted

## 2017-01-28 ENCOUNTER — Telehealth: Payer: Self-pay | Admitting: Cardiology

## 2017-01-28 NOTE — Telephone Encounter (Signed)
Pt scheduled 8/3 Dr Claiborne Billings @ 1030 pt will arrive at 8:30 am and verbally gave instructions - will also mail patient instruction letter

## 2017-01-28 NOTE — Telephone Encounter (Signed)
No precert required.  Pt has Medicare and supplement.

## 2017-01-28 NOTE — Telephone Encounter (Signed)
Pre-cert Verification for the following procedure   [facility] scheduled for 8/3 10:30AM with Dr Claiborne Billings

## 2017-01-28 NOTE — Telephone Encounter (Signed)
Pt says she is still having SOB and some chest pain anytime she does normal daily activities - says she took protonix over the weekend and says this made her stomach hurt

## 2017-01-28 NOTE — Telephone Encounter (Signed)
Please set this patient up for a left heart cath this week for chest pain   J Archimedes Harold MD

## 2017-01-30 ENCOUNTER — Other Ambulatory Visit: Payer: Self-pay | Admitting: Cardiology

## 2017-01-30 DIAGNOSIS — R0789 Other chest pain: Secondary | ICD-10-CM

## 2017-01-31 ENCOUNTER — Telehealth: Payer: Self-pay

## 2017-01-31 NOTE — Telephone Encounter (Signed)
Call placed to Pt for pre cath instruction.  Call went to VM.  Left message requesting call back to discuss Pt medications.  Per review of instruction letter Pt was not told to hold Metformin.  Await call back.

## 2017-01-31 NOTE — Telephone Encounter (Signed)
Left detailed message per DPR.  Patient contacted pre-catheterization at [facility] scheduled for: 02/01/2017 @ 1030  Verified arrival time and place:  [facility] @ 0800  Confirmed AM meds to be taken pre-cath with sip of water: Metformin-Notified Pt to not take evening dose(if she has one) provided education on holding metformin and why.  Notified to hold day of procedure and 48 hours after. Notified to hold losartan/HCTZ Notified to take ASA 81 mg prior to arrival  Notified patient must have responsible person to drive home post procedure and observe patient for 24 hours Addl concerns:   Left this nurse name and # if any further questions

## 2017-02-01 ENCOUNTER — Ambulatory Visit (HOSPITAL_COMMUNITY)
Admission: RE | Admit: 2017-02-01 | Discharge: 2017-02-01 | Disposition: A | Discharge status: Home / Self Care | Payer: Medicare Other | Source: Ambulatory Visit | Attending: Cardiovascular Disease | Admitting: Cardiovascular Disease

## 2017-02-01 ENCOUNTER — Encounter (HOSPITAL_COMMUNITY)
Admission: RE | Disposition: A | Discharge status: Home / Self Care | Payer: Self-pay | Source: Ambulatory Visit | Attending: Cardiovascular Disease

## 2017-02-01 DIAGNOSIS — R079 Chest pain, unspecified: Secondary | ICD-10-CM | POA: Diagnosis present

## 2017-02-01 DIAGNOSIS — Z7982 Long term (current) use of aspirin: Secondary | ICD-10-CM | POA: Diagnosis not present

## 2017-02-01 DIAGNOSIS — I2510 Atherosclerotic heart disease of native coronary artery without angina pectoris: Secondary | ICD-10-CM | POA: Diagnosis not present

## 2017-02-01 DIAGNOSIS — I10 Essential (primary) hypertension: Secondary | ICD-10-CM | POA: Insufficient documentation

## 2017-02-01 DIAGNOSIS — Z87891 Personal history of nicotine dependence: Secondary | ICD-10-CM | POA: Diagnosis not present

## 2017-02-01 DIAGNOSIS — E119 Type 2 diabetes mellitus without complications: Secondary | ICD-10-CM | POA: Diagnosis not present

## 2017-02-01 DIAGNOSIS — Z8249 Family history of ischemic heart disease and other diseases of the circulatory system: Secondary | ICD-10-CM | POA: Diagnosis not present

## 2017-02-01 DIAGNOSIS — F329 Major depressive disorder, single episode, unspecified: Secondary | ICD-10-CM | POA: Diagnosis not present

## 2017-02-01 DIAGNOSIS — G8929 Other chronic pain: Secondary | ICD-10-CM | POA: Diagnosis not present

## 2017-02-01 DIAGNOSIS — Z7984 Long term (current) use of oral hypoglycemic drugs: Secondary | ICD-10-CM | POA: Diagnosis not present

## 2017-02-01 DIAGNOSIS — M797 Fibromyalgia: Secondary | ICD-10-CM | POA: Diagnosis not present

## 2017-02-01 DIAGNOSIS — E785 Hyperlipidemia, unspecified: Secondary | ICD-10-CM | POA: Insufficient documentation

## 2017-02-01 DIAGNOSIS — G4733 Obstructive sleep apnea (adult) (pediatric): Secondary | ICD-10-CM | POA: Insufficient documentation

## 2017-02-01 DIAGNOSIS — R0789 Other chest pain: Secondary | ICD-10-CM

## 2017-02-01 DIAGNOSIS — M549 Dorsalgia, unspecified: Secondary | ICD-10-CM | POA: Diagnosis not present

## 2017-02-01 HISTORY — PX: LEFT HEART CATH AND CORONARY ANGIOGRAPHY: CATH118249

## 2017-02-01 LAB — BASIC METABOLIC PANEL
ANION GAP: 7 (ref 5–15)
BUN: 15 mg/dL (ref 6–20)
CALCIUM: 9.2 mg/dL (ref 8.9–10.3)
CO2: 27 mmol/L (ref 22–32)
Chloride: 104 mmol/L (ref 101–111)
Creatinine, Ser: 0.77 mg/dL (ref 0.44–1.00)
GFR calc Af Amer: >60 mL/min (ref >60)
GLUCOSE: 118 mg/dL — AB (ref 65–99)
POTASSIUM: 3.6 mmol/L (ref 3.5–5.1)
Sodium: 138 mmol/L (ref 135–145)

## 2017-02-01 LAB — CBC
HEMATOCRIT: 39.6 % (ref 36.0–46.0)
HEMOGLOBIN: 13.1 g/dL (ref 12.0–15.0)
MCH: 29.4 pg (ref 26.0–34.0)
MCHC: 33.1 g/dL (ref 30.0–36.0)
MCV: 88.8 fL (ref 78.0–100.0)
Platelets: 219 10*3/uL (ref 150–400)
RBC: 4.46 MIL/uL (ref 3.87–5.11)
RDW: 12.9 % (ref 11.5–15.5)
WBC: 8.3 10*3/uL (ref 4.0–10.5)

## 2017-02-01 LAB — GLUCOSE, CAPILLARY: Glucose-Capillary: 119 mg/dL — ABNORMAL HIGH (ref 65–99)

## 2017-02-01 LAB — PROTIME-INR
INR: 0.87
Prothrombin Time: 11.8 seconds (ref 11.4–15.2)

## 2017-02-01 SURGERY — LEFT HEART CATH AND CORONARY ANGIOGRAPHY
Anesthesia: LOCAL

## 2017-02-01 MED ORDER — MIDAZOLAM HCL 2 MG/2ML IJ SOLN
INTRAMUSCULAR | Status: DC | PRN
  Administered 2017-02-01: 2 mg via INTRAVENOUS

## 2017-02-01 MED ORDER — ASPIRIN 81 MG PO CHEW: 81.0000 mg | CHEWABLE_TABLET | ORAL | Status: DC

## 2017-02-01 MED ORDER — SODIUM CHLORIDE 0.9% FLUSH: 3.0000 mL | Freq: Two times a day (BID) | INTRAVENOUS | Status: DC

## 2017-02-01 MED ORDER — LIDOCAINE HCL (PF) 1 % IJ SOLN
INTRAMUSCULAR | Status: DC | PRN
  Administered 2017-02-01: 2 mL

## 2017-02-01 MED ORDER — HEPARIN SODIUM (PORCINE) 1000 UNIT/ML IJ SOLN
INTRAMUSCULAR | Status: AC
  Filled 2017-02-01: qty 1

## 2017-02-01 MED ORDER — SODIUM CHLORIDE 0.9% FLUSH: 3.0000 mL | INTRAVENOUS | Status: DC | PRN

## 2017-02-01 MED ORDER — ONDANSETRON HCL 4 MG/2ML IJ SOLN: 4.0000 mg | Freq: Four times a day (QID) | INTRAMUSCULAR | Status: DC | PRN

## 2017-02-01 MED ORDER — HEPARIN SODIUM (PORCINE) 1000 UNIT/ML IJ SOLN
INTRAMUSCULAR | Status: DC | PRN
  Administered 2017-02-01: 4000 [IU] via INTRAVENOUS

## 2017-02-01 MED ORDER — SODIUM CHLORIDE 0.9 % IV SOLN: INTRAVENOUS | Status: DC

## 2017-02-01 MED ORDER — SODIUM CHLORIDE 0.9 % WEIGHT BASED INFUSION: 1.0000 mL/kg/h | INTRAVENOUS | Status: DC

## 2017-02-01 MED ORDER — DIAZEPAM 5 MG PO TABS: 5.0000 mg | ORAL_TABLET | Freq: Four times a day (QID) | ORAL | Status: DC | PRN

## 2017-02-01 MED ORDER — SODIUM CHLORIDE 0.9 % IV SOLN: 250.0000 mL | INTRAVENOUS | Status: DC | PRN

## 2017-02-01 MED ORDER — FENTANYL CITRATE (PF) 100 MCG/2ML IJ SOLN
INTRAMUSCULAR | Status: AC
  Filled 2017-02-01: qty 2

## 2017-02-01 MED ORDER — IOPAMIDOL (ISOVUE-370) INJECTION 76%
INTRAVENOUS | Status: AC
  Filled 2017-02-01: qty 100

## 2017-02-01 MED ORDER — HEPARIN (PORCINE) IN NACL 2-0.9 UNIT/ML-% IJ SOLN
INTRAMUSCULAR | Status: AC
  Filled 2017-02-01: qty 1000

## 2017-02-01 MED ORDER — FENTANYL CITRATE (PF) 100 MCG/2ML IJ SOLN
INTRAMUSCULAR | Status: DC | PRN
  Administered 2017-02-01: 50 ug via INTRAVENOUS

## 2017-02-01 MED ORDER — SODIUM CHLORIDE 0.9 % WEIGHT BASED INFUSION
3.0000 mL/kg/h | INTRAVENOUS | Status: AC
  Administered 2017-02-01: 3 mL/kg/h via INTRAVENOUS

## 2017-02-01 MED ORDER — HEPARIN (PORCINE) IN NACL 2-0.9 UNIT/ML-% IJ SOLN
INTRAMUSCULAR | Status: AC | PRN
  Administered 2017-02-01: 1000 mL

## 2017-02-01 MED ORDER — MIDAZOLAM HCL 2 MG/2ML IJ SOLN
INTRAMUSCULAR | Status: AC
Start: 2017-02-01 — End: ?
  Filled 2017-02-01: qty 2

## 2017-02-01 MED ORDER — ACETAMINOPHEN 325 MG PO TABS: 650.0000 mg | ORAL_TABLET | ORAL | Status: DC | PRN

## 2017-02-01 MED ORDER — IOPAMIDOL (ISOVUE-370) INJECTION 76%
INTRAVENOUS | Status: DC | PRN
  Administered 2017-02-01: 75 mL via INTRA_ARTERIAL

## 2017-02-01 MED ORDER — LIDOCAINE HCL (PF) 1 % IJ SOLN
INTRAMUSCULAR | Status: AC
  Filled 2017-02-01: qty 30

## 2017-02-01 MED ORDER — HEPARIN (PORCINE) IN NACL 2-0.9 UNIT/ML-% IJ SOLN
INTRAMUSCULAR | Status: DC | PRN
  Administered 2017-02-01: 10 mL via INTRA_ARTERIAL

## 2017-02-01 SURGICAL SUPPLY — 11 items

## 2017-02-01 NOTE — Interval H&P Note (Signed)
Cath Lab Visit (complete for each Cath Lab visit)  Clinical Evaluation Leading to the Procedure:   ACS: No.  Non-ACS:    Anginal Classification: CCS III  Anti-ischemic medical therapy: Maximal Therapy (2 or more classes of medications)  Non-Invasive Test Results: No non-invasive testing performed  Prior CABG: No previous CABG      History and Physical Interval Note:  02/01/2017 9:48 AM  Cassandra Mccullough  has presented today for surgery, with the diagnosis of cp  The various methods of treatment have been discussed with the patient and family. After consideration of risks, benefits and other options for treatment, the patient has consented to  Procedure(s): Left Heart Cath and Coronary Angiography (N/A) as a surgical intervention .  The patient's history has been reviewed, patient examined, no change in status, stable for surgery.  I have reviewed the patient's chart and labs.  Questions were answered to the patient's satisfaction.     Shelva Majestic

## 2017-02-01 NOTE — H&P (View-Only) (Signed)
Clinical Summary Cassandra Mccullough is a 66 y.o.female seen today for follow up of the following medical problems.   1. Chest pain - long history of chest pain symptoms - from Dr Ron Parker old clinic notes seen in 2011 for chest pain, negative workup by echo and pharmacological stress.  - reports recent chest pain episodes starting yesterday. Sharp pain midchest 5/10 in severity with associated SOB, +diaphoresis. Has had some intermittent palpitations on and off since that time.   - 11/2016 ER visit to [facility] with chest pain. Negative workup for ACS, thought to be GI related.  - CXR did show aortic atherosclerosis.   - sharp like pain, left chest. 6-8/10 in severity. Can occur with activity. Can have some SOB with it, also with increased DOE. Pain will last 5-15 minutes. Unsure if positional  - total of 6-7 episodes over the last 2 months.  CAD risk factors: diet controlled DM2, HTN, former tobacco, brothers x 2 with CABG.  - stress test last year, she reports it was normal.    Past Medical History:  Diagnosis Date  . Blister of heel 05/2012   fracture blisters right heel  . Calcaneus fracture, right 04/29/2012  . Chronic back pain   . Dental crowns present   . Depression   . Diabetes mellitus without complication ([place])   . Dyslipidemia   . Fibromyalgia   . Fibromyalgia 10/04/2015  . Heartburn    occasional - OTC as needed  . History of GI bleed   . Hypertension    under control, has been on med. x 5-6 yrs.  . OSA (obstructive sleep apnea) 10/04/2015  . PONV (postoperative nausea and vomiting)   . Wears partial dentures    upper and lower     Allergies  Allergen Reactions  . Aspirin Nausea And Vomiting  . Nsaids Other (See Comments)    GI BLEED     Current Outpatient Prescriptions  Medication Sig Dispense Refill  . amLODipine (NORVASC) 10 MG tablet Take 10 mg by mouth at bedtime.     Marland Kitchen aspirin 81 MG tablet Take 81 mg by mouth every morning.     . cephALEXin (KEFLEX)  500 MG capsule Take 1 capsule (500 mg total) by mouth 4 (four) times daily. 28 capsule 0  . cholecalciferol (VITAMIN D) 1000 UNITS tablet Take 1,000 Units by mouth 2 (two) times daily.     . fish oil-omega-3 fatty acids 1000 MG capsule Take 1,500 mg by mouth daily.     Marland Kitchen FLUoxetine (PROZAC) 20 MG capsule Take 60 mg by mouth daily.     Marland Kitchen losartan-hydrochlorothiazide (HYZAAR) 100-25 MG per tablet Take 1 tablet by mouth every morning.     . metFORMIN (GLUCOPHAGE-XR) 500 MG 24 hr tablet Take 500 mg by mouth daily with breakfast.    . traMADol (ULTRAM) 50 MG tablet Take 1 tablet (50 mg total) by mouth every 6 (six) hours as needed. 20 tablet 0   No current facility-administered medications for this visit.      Past Surgical History:  Procedure Laterality Date  . ABDOMINAL HYSTERECTOMY     partial  . ANTERIOR CERVICAL DISCECTOMY  07/18/1998   C5-6, C6-7 with arthrodesis  . APPENDECTOMY    . BILATERAL OOPHORECTOMY     with bilat. salpingectomy  . FOOT SURGERY     right  . ORIF CALCANEOUS FRACTURE  05/14/2012   Procedure: OPEN REDUCTION INTERNAL FIXATION (ORIF) CALCANEOUS FRACTURE;  Surgeon: Rayvon Char  Grandville Silos, MD;  Location: [facility];  Service: Orthopedics;  Laterality: Right;  . POSTERIOR LUMBAR FUSION  10/04/2004  . TRACHEOSTOMY    . TYMPANOPLASTY       Allergies  Allergen Reactions  . Aspirin Nausea And Vomiting  . Nsaids Other (See Comments)    GI BLEED      Family History  Problem Relation Age of Onset  . Prostate cancer Father   . Colon cancer Mother      Social History Ms. Kopf reports that she has quit smoking. She has never used smokeless tobacco. Ms. Avis reports that she does not drink alcohol.   Review of Systems CONSTITUTIONAL: No weight loss, fever, chills, weakness or fatigue.  HEENT: Eyes: No visual loss, blurred vision, double vision or yellow sclerae.No hearing loss, sneezing, congestion, runny nose or sore throat.  SKIN: No rash  or itching.  CARDIOVASCULAR: per hpi RESPIRATORY: No shortness of breath, cough or sputum.  GASTROINTESTINAL: No anorexia, nausea, vomiting or diarrhea. No abdominal pain or blood.  GENITOURINARY: No burning on urination, no polyuria NEUROLOGICAL: No headache, dizziness, syncope, paralysis, ataxia, numbness or tingling in the extremities. No change in bowel or bladder control.  MUSCULOSKELETAL: No muscle, back pain, joint pain or stiffness.  LYMPHATICS: No enlarged nodes. No history of splenectomy.  PSYCHIATRIC: No history of depression or anxiety.  ENDOCRINOLOGIC: No reports of sweating, cold or heat intolerance. No polyuria or polydipsia.  Marland Kitchen   Physical Examination Vitals:   01/17/17 1446 01/17/17 1453  BP: 118/70 114/68  Pulse: (!) 58    Vitals:   01/17/17 1446  Weight: 174 lb 9.6 oz (79.2 kg)  Height: 6' (1.829 m)    Gen: resting comfortably, no acute distress HEENT: no scleral icterus, pupils equal round and reactive, no palptable cervical adenopathy,  CV: RRR, no m/r/g, no jvd Resp: Clear to auscultation bilaterally GI: abdomen is soft, non-tender, non-distended, normal bowel sounds, no hepatosplenomegaly MSK: extremities are warm, no edema.  Skin: warm, no rash Neuro:  no focal deficits Psych: appropriate affect     Assessment and Plan  1. Chest pain - unclear etiology - we will obtain echo to evaluate cardiac function. Request results of prior stress test - pending echo and prior stress results, consider repeat ischemic testing either invasive or noninvasive   F/u pending test results  Addendum 01/22/17 12/2015 Lexiscan results received from [facility], no evidence of ishcemia.     Arnoldo Lenis, M.D., F.A.C.C.

## 2017-02-01 NOTE — Discharge Instructions (Signed)

## 2017-02-01 NOTE — Research (Signed)
OPTIMIZE Informed Consent   Subject Name: Cassandra Mccullough  Subject met inclusion and exclusion criteria.  The informed consent form, study requirements and expectations were reviewed with the subject and questions and concerns were addressed prior to the signing of the consent form.  The subject verbalized understanding of the trail requirements.  The subject agreed to participate in the OPTIMIZE trial and signed the informed consent.  The informed consent was obtained prior to performance of any protocol-specific procedures for the subject.  A copy of the signed informed consent was given to the subject and a copy was placed in the subject's medical record. Applicable only if Randomized.  Hedrick,Vernadine Coombs W 02/01/2017, 1224

## 2017-02-04 ENCOUNTER — Encounter (HOSPITAL_COMMUNITY): Payer: Self-pay | Admitting: Cardiovascular Disease

## 2017-02-04 NOTE — Progress Notes (Signed)
Heart cath looks good, there are no significant blockages. She needs to f/u with her pcp to consider noncardiac causes of her symptoms. F/u with [place] in 3 months   [name] MD

## 2017-02-27 DIAGNOSIS — L919 Hypertrophic disorder of the skin, unspecified: Secondary | ICD-10-CM | POA: Diagnosis not present

## 2017-02-27 DIAGNOSIS — D485 Neoplasm of uncertain behavior of skin: Secondary | ICD-10-CM | POA: Diagnosis not present

## 2017-02-27 DIAGNOSIS — C44519 Basal cell carcinoma of skin of other part of trunk: Secondary | ICD-10-CM | POA: Diagnosis not present

## 2017-02-27 DIAGNOSIS — L570 Actinic keratosis: Secondary | ICD-10-CM | POA: Diagnosis not present

## 2017-02-27 DIAGNOSIS — L821 Other seborrheic keratosis: Secondary | ICD-10-CM | POA: Diagnosis not present

## 2017-02-27 DIAGNOSIS — D2271 Melanocytic nevi of right lower limb, including hip: Secondary | ICD-10-CM | POA: Diagnosis not present

## 2017-03-19 ENCOUNTER — Ambulatory Visit (INDEPENDENT_AMBULATORY_CARE_PROVIDER_SITE_OTHER): Payer: Medicare Other | Admitting: Cardiology

## 2017-03-19 ENCOUNTER — Encounter: Payer: Self-pay | Admitting: Cardiology

## 2017-03-19 VITALS — BP 118/68 | HR 61 | Ht 72.0 in | Wt 174.4 lb

## 2017-03-19 DIAGNOSIS — R002 Palpitations: Secondary | ICD-10-CM

## 2017-03-19 DIAGNOSIS — I2510 Atherosclerotic heart disease of native coronary artery without angina pectoris: Secondary | ICD-10-CM

## 2017-03-19 MED ORDER — ISOSORBIDE MONONITRATE ER 30 MG PO TB24: 15.0000 mg | ORAL_TABLET | Freq: Every day | ORAL | 1 refills | Status: DC

## 2017-03-19 NOTE — Patient Instructions (Signed)
Your physician recommends that you schedule a follow-up appointment in: TO BE DETERMINED WITH DR [place] has recommended you make the following change in your medication:   START IMDUR 15 MG (1/2 TABLET) DAILY  PLEASE CALL AND UPDATE [place] IN 1 WEEK ON YOUR CHEST PAIN AND PALPITATIONS   Thank you for choosing [facility]!!

## 2017-03-19 NOTE — Progress Notes (Signed)
Clinical Summary Ms. Goodnow is a 66 y.o.female  seen today for follow up of the following medical problems.   1. Chest pain - long history of chest pain symptoms - from Dr Ron Parker old clinic notes seen in 2011 for chest pain, negative workup by echo and pharmacological stress.  - reports recent chest pain episodes starting yesterday. Sharp pain midchest 5/10 in severity with associated SOB, +diaphoresis. Has had some intermittent palpitations on and off since that time.   - 11/2016 ER visit to [facility] with chest pain. Negative workup for ACS, thought to be GI related.  - CXR did show aortic atherosclerosis.   12/2015 nuclear stress: no ischemia 12/2016 echo: LVEF 60-65%, no WMAs - 01/2017 cath: LM 35%, otherwise normal coronaries  - symptoms have improved since last visit, though still can occur with exertion.     2. Palpitations  no recent palpitations since last visit.  Past Medical History:  Diagnosis Date  . Blister of heel 05/2012   fracture blisters right heel  . Calcaneus fracture, right 04/29/2012  . Chronic back pain   . Dental crowns present   . Depression   . Diabetes mellitus without complication ([place])   . Dyslipidemia   . Fibromyalgia   . Fibromyalgia 10/04/2015  . Heartburn    occasional - OTC as needed  . History of GI bleed   . Hypertension    under control, has been on med. x 5-6 yrs.  . OSA (obstructive sleep apnea) 10/04/2015  . PONV (postoperative nausea and vomiting)   . Wears partial dentures    upper and lower     Allergies  Allergen Reactions  . Aspirin Nausea And Vomiting  . Nsaids Other (See Comments)    GI BLEED  . Other Nausea And Vomiting    general anesthesia  . Pantoprazole Sodium     Stomach cramps     Current Outpatient Prescriptions  Medication Sig Dispense Refill  . acetaminophen (TYLENOL) 500 MG tablet Take 1,000 mg by mouth daily as needed for moderate pain or headache.    Marland Kitchen amLODipine (NORVASC) 10 MG tablet Take 10  mg by mouth daily.     Marland Kitchen aspirin 81 MG tablet Take 81 mg by mouth every morning.     . Biotin 2500 MCG CAPS Take 2,500 mcg by mouth daily.    . Cholecalciferol (VITAMIN D PO) Take 1 capsule by mouth 2 (two) times daily.    Marland Kitchen FLUoxetine (PROZAC) 20 MG capsule Take 60 mg by mouth at bedtime.     . Liniments (SALONPAS PAIN RELIEF PATCH EX) Apply 1-2 patches topically daily as needed (pain).    Marland Kitchen losartan-hydrochlorothiazide (HYZAAR) 100-25 MG per tablet Take 1 tablet by mouth every evening.     . methocarbamol (ROBAXIN) 750 MG tablet Take 750 mg by mouth 3 (three) times daily as needed for muscle spasms.    Marland Kitchen OVER THE COUNTER MEDICATION Apply 1 application topically 3 (three) times daily as needed (pain). Oxyrub otc pain cream    . pantoprazole (PROTONIX) 40 MG tablet Take 1 tablet (40 mg total) by mouth daily. (Patient not taking: Reported on 01/30/2017) 90 tablet 1  . Probiotic CAPS Take 1 capsule by mouth daily.     No current facility-administered medications for this visit.      Past Surgical History:  Procedure Laterality Date  . ABDOMINAL HYSTERECTOMY     partial  . ANTERIOR CERVICAL DISCECTOMY  07/18/1998   C5-6,  C6-7 with arthrodesis  . APPENDECTOMY    . BILATERAL OOPHORECTOMY     with bilat. salpingectomy  . FOOT SURGERY     right  . LEFT HEART CATH AND CORONARY ANGIOGRAPHY N/A 02/01/2017   Procedure: Left Heart Cath and Coronary Angiography;  Surgeon: Troy Sine, MD;  Location: [facility];  Service: Cardiovascular;  Laterality: N/A;  . ORIF CALCANEOUS FRACTURE  05/14/2012   Procedure: OPEN REDUCTION INTERNAL FIXATION (ORIF) CALCANEOUS FRACTURE;  Surgeon: Jolyn Nap, MD;  Location: [facility];  Service: Orthopedics;  Laterality: Right;  . POSTERIOR LUMBAR FUSION  10/04/2004  . TRACHEOSTOMY    . TYMPANOPLASTY       Allergies  Allergen Reactions  . Aspirin Nausea And Vomiting  . Nsaids Other (See Comments)    GI BLEED  . Other Nausea And  Vomiting    general anesthesia  . Pantoprazole Sodium     Stomach cramps      Family History  Problem Relation Age of Onset  . Prostate cancer Father   . Colon cancer Mother      Social History Ms. Burrowes reports that she quit smoking about 15 years ago. She has never used smokeless tobacco. Ms. Liberati reports that she does not drink alcohol.   Review of Systems CONSTITUTIONAL: No weight loss, fever, chills, weakness or fatigue.  HEENT: Eyes: No visual loss, blurred vision, double vision or yellow sclerae.No hearing loss, sneezing, congestion, runny nose or sore throat.  SKIN: No rash or itching.  CARDIOVASCULAR: per hpi RESPIRATORY: No shortness of breath, cough or sputum.  GASTROINTESTINAL: No anorexia, nausea, vomiting or diarrhea. No abdominal pain or blood.  GENITOURINARY: No burning on urination, no polyuria NEUROLOGICAL: No headache, dizziness, syncope, paralysis, ataxia, numbness or tingling in the extremities. No change in bowel or bladder control.  MUSCULOSKELETAL: No muscle, back pain, joint pain or stiffness.  LYMPHATICS: No enlarged nodes. No history of splenectomy.  PSYCHIATRIC: No history of depression or anxiety.  ENDOCRINOLOGIC: No reports of sweating, cold or heat intolerance. No polyuria or polydipsia.  Marland Kitchen   Physical Examination Vitals:   03/19/17 1420  BP: 118/68  Pulse: 61  SpO2: 96%   Vitals:   03/19/17 1420  Weight: 174 lb 6.4 oz (79.1 kg)  Height: 6' (1.829 m)    Gen: resting comfortably, no acute distress HEENT: no scleral icterus, pupils equal round and reactive, no palptable cervical adenopathy,  CV: RRR, no m/r/g, no jvd Resp: Clear to auscultation bilaterally GI: abdomen is soft, non-tender, non-distended, normal bowel sounds, no hepatosplenomegaly MSK: extremities are warm, no edema.  Skin: warm, no rash Neuro:  no focal deficits Psych: appropriate affect   Diagnostic Studies 12/2016 echo Study Conclusions  - Left  ventricle: The cavity size was normal. Wall thickness was   normal. Systolic function was normal. The estimated ejection   fraction was in the range of 60% to 65%. Wall motion was normal;   there were no regional wall motion abnormalities. - Aortic valve: Valve area (Vmax): 2.49 cm^2. - Tricuspid valve: There was mild-moderate regurgitation. - Pulmonary arteries: Systolic pressure was moderately increased.   PA peak pressure: 44 mm Hg (S).   01/2017 cath  Ost LM lesion, 35 %stenosed.  The left ventricular ejection fraction is greater than 65% by visual estimate.  LV end diastolic pressure is normal.  The left ventricular systolic function is normal.   Hyperdynamic LV function with an ejection fraction greater than 65%.  Smooth  ostial narrowing of the left main coronary artery of 30-40% and otherwise normal LAD, diminutive left circumflex, and large dominant RCA.  RECOMMENDATION: Medical therapy. Assessment and Plan   1. CAD - mild nonobstructive disease by recent cath, continue medical therapy - only mild symptoms at times, start imdru 15mg  daily  2. Palpitations - symptoms improved since last visit, continue to monitor at this time.      Arnoldo Lenis, M.D

## 2017-04-09 DIAGNOSIS — I10 Essential (primary) hypertension: Secondary | ICD-10-CM | POA: Diagnosis not present

## 2017-04-09 DIAGNOSIS — F3289 Other specified depressive episodes: Secondary | ICD-10-CM | POA: Diagnosis not present

## 2017-04-09 DIAGNOSIS — R0789 Other chest pain: Secondary | ICD-10-CM | POA: Diagnosis not present

## 2017-04-09 DIAGNOSIS — E119 Type 2 diabetes mellitus without complications: Secondary | ICD-10-CM | POA: Diagnosis not present

## 2017-04-09 DIAGNOSIS — K210 Gastro-esophageal reflux disease with esophagitis: Secondary | ICD-10-CM | POA: Diagnosis not present

## 2017-04-10 DIAGNOSIS — Z1159 Encounter for screening for other viral diseases: Secondary | ICD-10-CM | POA: Diagnosis not present

## 2017-04-10 DIAGNOSIS — I10 Essential (primary) hypertension: Secondary | ICD-10-CM | POA: Diagnosis not present

## 2017-04-10 DIAGNOSIS — E1165 Type 2 diabetes mellitus with hyperglycemia: Secondary | ICD-10-CM | POA: Diagnosis not present

## 2017-04-11 DIAGNOSIS — R197 Diarrhea, unspecified: Secondary | ICD-10-CM | POA: Diagnosis not present

## 2017-04-11 DIAGNOSIS — C44519 Basal cell carcinoma of skin of other part of trunk: Secondary | ICD-10-CM | POA: Diagnosis not present

## 2017-04-29 DIAGNOSIS — Z4802 Encounter for removal of sutures: Secondary | ICD-10-CM | POA: Diagnosis not present

## 2017-06-13 DIAGNOSIS — M9904 Segmental and somatic dysfunction of sacral region: Secondary | ICD-10-CM | POA: Diagnosis not present

## 2017-06-13 DIAGNOSIS — M9905 Segmental and somatic dysfunction of pelvic region: Secondary | ICD-10-CM | POA: Diagnosis not present

## 2017-06-13 DIAGNOSIS — M545 Low back pain: Secondary | ICD-10-CM | POA: Diagnosis not present

## 2017-06-13 DIAGNOSIS — M9903 Segmental and somatic dysfunction of lumbar region: Secondary | ICD-10-CM | POA: Diagnosis not present

## 2017-06-14 ENCOUNTER — Encounter (HOSPITAL_COMMUNITY): Payer: Self-pay

## 2017-06-14 ENCOUNTER — Emergency Department (HOSPITAL_COMMUNITY): Payer: Medicare Other

## 2017-06-14 ENCOUNTER — Emergency Department (HOSPITAL_COMMUNITY)
Admission: EM | Admit: 2017-06-14 | Discharge: 2017-06-14 | Disposition: A | Discharge status: Home / Self Care | Payer: Medicare Other | Attending: Emergency Medicine | Admitting: Emergency Medicine

## 2017-06-14 DIAGNOSIS — Z87891 Personal history of nicotine dependence: Secondary | ICD-10-CM | POA: Diagnosis not present

## 2017-06-14 DIAGNOSIS — Z7982 Long term (current) use of aspirin: Secondary | ICD-10-CM | POA: Insufficient documentation

## 2017-06-14 DIAGNOSIS — M545 Low back pain, unspecified: Secondary | ICD-10-CM

## 2017-06-14 DIAGNOSIS — Z79899 Other long term (current) drug therapy: Secondary | ICD-10-CM | POA: Insufficient documentation

## 2017-06-14 DIAGNOSIS — I10 Essential (primary) hypertension: Secondary | ICD-10-CM | POA: Insufficient documentation

## 2017-06-14 LAB — CBC WITH DIFFERENTIAL/PLATELET
BASOS ABS: 0 10*3/uL (ref 0.0–0.1)
BASOS PCT: 0 %
EOS PCT: 1 %
Eosinophils Absolute: 0.1 10*3/uL (ref 0.0–0.7)
HCT: 40.9 % (ref 36.0–46.0)
Hemoglobin: 13 g/dL (ref 12.0–15.0)
LYMPHS PCT: 15 %
Lymphs Abs: 2.2 10*3/uL (ref 0.7–4.0)
MCH: 29.7 pg (ref 26.0–34.0)
MCHC: 31.8 g/dL (ref 30.0–36.0)
MCV: 93.6 fL (ref 78.0–100.0)
MONO ABS: 1 10*3/uL (ref 0.1–1.0)
Monocytes Relative: 7 %
Neutro Abs: 11.3 10*3/uL — ABNORMAL HIGH (ref 1.7–7.7)
Neutrophils Relative %: 77 %
Platelets: 300 10*3/uL (ref 150–400)
RBC: 4.37 MIL/uL (ref 3.87–5.11)
RDW: 12.9 % (ref 11.5–15.5)
WBC: 14.7 10*3/uL — AB (ref 4.0–10.5)

## 2017-06-14 LAB — BASIC METABOLIC PANEL
ANION GAP: 9 (ref 5–15)
BUN: 14 mg/dL (ref 6–20)
CALCIUM: 9.1 mg/dL (ref 8.9–10.3)
CO2: 26 mmol/L (ref 22–32)
Chloride: 102 mmol/L (ref 101–111)
Creatinine, Ser: 0.56 mg/dL (ref 0.44–1.00)
GFR calc Af Amer: >60 mL/min (ref >60)
Glucose, Bld: 184 mg/dL — ABNORMAL HIGH (ref 65–99)
POTASSIUM: 3.5 mmol/L (ref 3.5–5.1)
SODIUM: 137 mmol/L (ref 135–145)

## 2017-06-14 LAB — URINALYSIS, ROUTINE W REFLEX MICROSCOPIC
Bilirubin Urine: NEGATIVE
Glucose, UA: NEGATIVE mg/dL
Hgb urine dipstick: NEGATIVE
KETONES UR: NEGATIVE mg/dL
LEUKOCYTES UA: NEGATIVE
NITRITE: NEGATIVE
PH: 6 (ref 5.0–8.0)
PROTEIN: NEGATIVE mg/dL
Specific Gravity, Urine: 1.009 (ref 1.005–1.030)

## 2017-06-14 MED ORDER — OXYCODONE-ACETAMINOPHEN 5-325 MG PO TABS: 1.0000 | ORAL_TABLET | ORAL | 0 refills | Status: DC | PRN

## 2017-06-14 MED ORDER — ONDANSETRON HCL 4 MG/2ML IJ SOLN
4.0000 mg | Freq: Once | INTRAMUSCULAR | Status: AC
  Administered 2017-06-14: 4 mg via INTRAVENOUS
  Filled 2017-06-14: qty 2

## 2017-06-14 MED ORDER — PREDNISONE 10 MG PO TABS: ORAL_TABLET | ORAL | 0 refills | Status: DC

## 2017-06-14 MED ORDER — MORPHINE SULFATE (PF) 2 MG/ML IV SOLN
2.0000 mg | Freq: Once | INTRAVENOUS | Status: AC
  Administered 2017-06-14: 2 mg via INTRAVENOUS
  Filled 2017-06-14: qty 1

## 2017-06-14 MED ORDER — METHOCARBAMOL 500 MG PO TABS: 500.0000 mg | ORAL_TABLET | Freq: Three times a day (TID) | ORAL | 0 refills | Status: DC

## 2017-06-14 MED ORDER — METHOCARBAMOL 500 MG PO TABS
500.0000 mg | ORAL_TABLET | Freq: Once | ORAL | Status: AC
  Administered 2017-06-14: 500 mg via ORAL
  Filled 2017-06-14: qty 1

## 2017-06-14 MED ORDER — MORPHINE SULFATE (PF) 4 MG/ML IV SOLN
4.0000 mg | Freq: Once | INTRAVENOUS | Status: AC
  Administered 2017-06-14: 4 mg via INTRAVENOUS
  Filled 2017-06-14: qty 1

## 2017-06-14 NOTE — ED Triage Notes (Signed)
Pt c/o pain in lower back x 4 days.  Denies injury.  Pt saw chiropractor yesterday and was told she may have a ruptured disc.

## 2017-06-14 NOTE — Discharge Instructions (Signed)
Apply ice packs on/off to your back.  Call your primary doctor to arrange a follow-up appt.  Return here for any worsening symptoms

## 2017-06-14 NOTE — ED Provider Notes (Signed)
[facility] EMERGENCY DEPARTMENT Provider Note   CSN: [id number] Arrival date & time: 06/14/17  3557     History   Chief Complaint Chief Complaint  Patient presents with  . Back Pain    HPI Cassandra Mccullough is a 66 y.o. female.  HPI  Cassandra Mccullough is a 66 y.o. female who presents to the Emergency Department complaining of increasing low back pain for 4 days.  She reports a history of chronic back pain but states this pain is much worse.  She recalls sitting in the floor prior to onset of this exacerbation and she believes this may have caused her back to hurt.  She describes a constant, throbbing pain that is worse with movement.  Pain radiates into her groin and bilateral hips.  She was seen by chiropractor yesterday and had some manipulation performed.  She states her pain seems to be worse today.  She denies fever, chills, abdominal pain, pain numbness or weakness to her lower extremities, urine or bowel changes.  No known trauma.   Past Medical History:  Diagnosis Date  . Blister of heel 05/2012   fracture blisters right heel  . Calcaneus fracture, right 04/29/2012  . Chronic back pain   . Dental crowns present   . Depression   . Diabetes mellitus without complication ([place])   . Dyslipidemia   . Fibromyalgia   . Fibromyalgia 10/04/2015  . Heartburn    occasional - OTC as needed  . History of GI bleed   . Hypertension    under control, has been on med. x 5-6 yrs.  . OSA (obstructive sleep apnea) 10/04/2015  . PONV (postoperative nausea and vomiting)   . Wears partial dentures    upper and lower    Patient Active Problem List   Diagnosis Date Noted  . SHORTNESS OF BREATH 04/04/2010  . CHEST PAIN 04/04/2010  . DYSLIPIDEMIA 04/03/2010  . HYPERTENSION 04/03/2010  . BRADYCARDIA 04/03/2010    Past Surgical History:  Procedure Laterality Date  . ABDOMINAL HYSTERECTOMY     partial  . ANTERIOR CERVICAL DISCECTOMY  07/18/1998   C5-6, C6-7 with arthrodesis  .  APPENDECTOMY    . BILATERAL OOPHORECTOMY     with bilat. salpingectomy  . FOOT SURGERY     right  . LEFT HEART CATH AND CORONARY ANGIOGRAPHY N/A 02/01/2017   Procedure: Left Heart Cath and Coronary Angiography;  Surgeon: Troy Sine, MD;  Location: [facility];  Service: Cardiovascular;  Laterality: N/A;  . ORIF CALCANEOUS FRACTURE  05/14/2012   Procedure: OPEN REDUCTION INTERNAL FIXATION (ORIF) CALCANEOUS FRACTURE;  Surgeon: Jolyn Nap, MD;  Location: [facility];  Service: Orthopedics;  Laterality: Right;  . POSTERIOR LUMBAR FUSION  10/04/2004  . TRACHEOSTOMY    . TYMPANOPLASTY      OB History    No data available       Home Medications    Prior to Admission medications   Medication Sig Start Date End Date Taking? Authorizing Provider  acetaminophen (TYLENOL) 500 MG tablet Take 1,000 mg by mouth daily as needed for moderate pain or headache.    [provider]  amLODipine (NORVASC) 10 MG tablet Take 10 mg by mouth daily.     [provider]  aspirin 81 MG tablet Take 81 mg by mouth every morning.     [provider]  Biotin 2500 MCG CAPS Take 2,500 mcg by mouth daily.    [provider]  Cholecalciferol (VITAMIN D PO) Take 1 capsule by mouth 2 (two) times daily.    [provider]  FLUoxetine (PROZAC) 20 MG capsule Take 60 mg by mouth at bedtime.     [provider]  isosorbide mononitrate (IMDUR) 30 MG 24 hr tablet Take 0.5 tablets (15 mg total) by mouth daily. 03/19/17 06/17/17  Arnoldo Lenis, MD  Liniments (SALONPAS PAIN RELIEF PATCH EX) Apply 1-2 patches topically daily as needed (pain).    [provider]  losartan-hydrochlorothiazide (HYZAAR) 100-25 MG per tablet Take 1 tablet by mouth every evening.     [provider]  OVER THE COUNTER MEDICATION Apply 1 application topically 3 (three) times daily as needed (pain). Oxyrub otc pain cream    [provider]    Probiotic CAPS Take 1 capsule by mouth daily.    [provider]    Family History Family History  Problem Relation Age of Onset  . Prostate cancer Father   . Colon cancer Mother     Social History Social History   Tobacco Use  . Smoking status: Former Smoker    Last attempt to quit: 07/02/2001    Years since quitting: 15.9  . Smokeless tobacco: Never Used  . Tobacco comment: quit smoking > 10 yrs. ago  Substance Use Topics  . Alcohol use: No    Alcohol/week: 0.0 oz  . Drug use: No     Allergies   Aspirin; Nsaids; Other; and Pantoprazole sodium   Review of Systems Review of Systems  Constitutional: Negative for fever.  Respiratory: Negative for shortness of breath.   Gastrointestinal: Negative for abdominal pain, constipation and vomiting.  Genitourinary: Negative for decreased urine volume, difficulty urinating, dysuria, flank pain and hematuria.  Musculoskeletal: Positive for back pain. Negative for joint swelling.  Skin: Negative for rash.  Neurological: Negative for weakness and numbness.  All other systems reviewed and are negative.    Physical Exam Updated Vital Signs BP 137/77 (BP Location: Right Arm)   Pulse 73   Temp 98.3 F (36.8 C) (Oral)   Resp 18   Ht 6' (1.829 m)   Wt 79.8 kg (176 lb)   SpO2 95%   BMI 23.87 kg/m   Physical Exam  Constitutional: She is oriented to person, place, and time. She appears well-developed and well-nourished.  Pt is uncomfortable appearing and tearful  HENT:  Head: Normocephalic and atraumatic.  Mouth/Throat: Oropharynx is clear and moist.  Neck: Normal range of motion. Neck supple.  Cardiovascular: Normal rate, regular rhythm and intact distal pulses.  No murmur heard. Pulmonary/Chest: Effort normal and breath sounds normal. No respiratory distress. She exhibits no tenderness.  Abdominal: Soft. She exhibits no distension. There is no tenderness. There is no guarding.  Musculoskeletal: She exhibits  tenderness. She exhibits no edema.       Lumbar back: She exhibits tenderness and pain. She exhibits normal range of motion, no swelling, no deformity, no laceration and normal pulse.  Diffuse ttp of the bilateral lumbar paraspinal muscles and lower lumbar spine.  Pain is reproduced with straight leg raise bilaterally   Neurological: She is alert and oriented to person, place, and time. She has normal strength. No sensory deficit. She exhibits normal muscle tone. Coordination and gait normal.  Reflex Scores:      Patellar reflexes are 2+ on the right side and 2+ on the left side.      Achilles reflexes are 2+ on the right side and 2+ on the  left side. Skin: Skin is warm and dry. Capillary refill takes less than 2 seconds. No rash noted.  Psychiatric: She has a normal mood and affect.  Nursing note and vitals reviewed.    ED Treatments / Results  Labs (all labs ordered are listed, but only abnormal results are displayed) Labs Reviewed  BASIC METABOLIC PANEL  CBC WITH DIFFERENTIAL/PLATELET  URINALYSIS, ROUTINE W REFLEX MICROSCOPIC    EKG  EKG Interpretation None       Radiology Dg Lumbar Spine Complete  Result Date: 06/14/2017 CLINICAL DATA:  Forty history of low back pain. EXAM: LUMBAR SPINE - COMPLETE 4+ VIEW COMPARISON:  Intraoperative film from 10/04/2004 FINDINGS: No fracture. No evidence for subluxation. Patient is status post fusion at L5-S1. Bones are diffusely demineralized. SI joints are unremarkable. IMPRESSION: L5-S1 fusion without acute bony abnormality. Electronically Signed   By: Misty Stanley M.D.   On: 06/14/2017 10:44     Procedures Procedures (including critical care time)  Medications Ordered in ED Medications  morphine 4 MG/ML injection 4 mg (not administered)  ondansetron (ZOFRAN) injection 4 mg (not administered)  methocarbamol (ROBAXIN) tablet 500 mg (not administered)     Initial Impression / Assessment and Plan / ED Course  I have reviewed the  triage vital signs and the nursing notes.  Pertinent labs & imaging results that were available during my care of the patient were reviewed by me and considered in my medical decision making (see chart for details).     Patient is feeling better after medications.  She is ambulated in the department with a steady gait.  Symptoms likely related to acute on chronic low back pain.  No radiculopathy or red flags on exam.  She appears stable for discharge.  She agrees to follow-up with her PCP, return precautions were discussed.  Final Clinical Impressions(s) / ED Diagnoses   Final diagnoses:  Acute bilateral low back pain without sciatica    ED Discharge Orders    None       Bufford Lope 06/16/17 2041    Nat Christen, MD 06/17/17 [phone]

## 2017-06-14 NOTE — ED Notes (Signed)
Patient transported to X-ray 

## 2017-06-14 NOTE — ED Notes (Signed)
Assisted patient to bathroom. Patient ambulated with this nurse at side. Patient able to ambulate but gait unsteady. This nurse stayed at patient's side in bathroom. Returned patient to bed. Retrieved urine sample at this time.

## 2017-07-10 DIAGNOSIS — E119 Type 2 diabetes mellitus without complications: Secondary | ICD-10-CM | POA: Diagnosis not present

## 2017-07-10 DIAGNOSIS — I10 Essential (primary) hypertension: Secondary | ICD-10-CM | POA: Diagnosis not present

## 2017-07-10 DIAGNOSIS — R0789 Other chest pain: Secondary | ICD-10-CM | POA: Diagnosis not present

## 2017-07-10 DIAGNOSIS — F3289 Other specified depressive episodes: Secondary | ICD-10-CM | POA: Diagnosis not present

## 2017-07-10 DIAGNOSIS — K210 Gastro-esophageal reflux disease with esophagitis: Secondary | ICD-10-CM | POA: Diagnosis not present

## 2017-10-16 DIAGNOSIS — F3289 Other specified depressive episodes: Secondary | ICD-10-CM | POA: Diagnosis not present

## 2017-10-16 DIAGNOSIS — I10 Essential (primary) hypertension: Secondary | ICD-10-CM | POA: Diagnosis not present

## 2017-10-16 DIAGNOSIS — M7918 Myalgia, other site: Secondary | ICD-10-CM | POA: Diagnosis not present

## 2017-10-16 DIAGNOSIS — E119 Type 2 diabetes mellitus without complications: Secondary | ICD-10-CM | POA: Diagnosis not present

## 2017-10-16 DIAGNOSIS — Z0000 Encounter for general adult medical examination without abnormal findings: Secondary | ICD-10-CM | POA: Diagnosis not present

## 2017-10-16 DIAGNOSIS — K210 Gastro-esophageal reflux disease with esophagitis: Secondary | ICD-10-CM | POA: Diagnosis not present

## 2017-10-21 DIAGNOSIS — M7918 Myalgia, other site: Secondary | ICD-10-CM | POA: Diagnosis not present

## 2017-10-21 DIAGNOSIS — E119 Type 2 diabetes mellitus without complications: Secondary | ICD-10-CM | POA: Diagnosis not present

## 2017-10-21 DIAGNOSIS — I10 Essential (primary) hypertension: Secondary | ICD-10-CM | POA: Diagnosis not present

## 2017-10-21 DIAGNOSIS — Z0000 Encounter for general adult medical examination without abnormal findings: Secondary | ICD-10-CM | POA: Diagnosis not present

## 2017-10-21 DIAGNOSIS — F3289 Other specified depressive episodes: Secondary | ICD-10-CM | POA: Diagnosis not present

## 2017-10-21 DIAGNOSIS — K210 Gastro-esophageal reflux disease with esophagitis: Secondary | ICD-10-CM | POA: Diagnosis not present

## 2017-12-05 DIAGNOSIS — M79605 Pain in left leg: Secondary | ICD-10-CM | POA: Diagnosis not present

## 2017-12-05 DIAGNOSIS — K210 Gastro-esophageal reflux disease with esophagitis: Secondary | ICD-10-CM | POA: Diagnosis not present

## 2017-12-05 DIAGNOSIS — M7918 Myalgia, other site: Secondary | ICD-10-CM | POA: Diagnosis not present

## 2017-12-05 DIAGNOSIS — E119 Type 2 diabetes mellitus without complications: Secondary | ICD-10-CM | POA: Diagnosis not present

## 2017-12-05 DIAGNOSIS — I10 Essential (primary) hypertension: Secondary | ICD-10-CM | POA: Diagnosis not present

## 2017-12-05 DIAGNOSIS — F3289 Other specified depressive episodes: Secondary | ICD-10-CM | POA: Diagnosis not present

## 2017-12-06 DIAGNOSIS — R600 Localized edema: Secondary | ICD-10-CM | POA: Diagnosis not present

## 2017-12-06 DIAGNOSIS — M79606 Pain in leg, unspecified: Secondary | ICD-10-CM | POA: Diagnosis not present

## 2018-02-26 DIAGNOSIS — B078 Other viral warts: Secondary | ICD-10-CM | POA: Diagnosis not present

## 2018-02-26 DIAGNOSIS — D485 Neoplasm of uncertain behavior of skin: Secondary | ICD-10-CM | POA: Diagnosis not present

## 2018-02-26 DIAGNOSIS — L570 Actinic keratosis: Secondary | ICD-10-CM | POA: Diagnosis not present

## 2018-02-26 DIAGNOSIS — Z85828 Personal history of other malignant neoplasm of skin: Secondary | ICD-10-CM | POA: Diagnosis not present

## 2018-02-26 DIAGNOSIS — L821 Other seborrheic keratosis: Secondary | ICD-10-CM | POA: Diagnosis not present

## 2018-02-26 DIAGNOSIS — B359 Dermatophytosis, unspecified: Secondary | ICD-10-CM | POA: Diagnosis not present

## 2018-03-18 DIAGNOSIS — K210 Gastro-esophageal reflux disease with esophagitis: Secondary | ICD-10-CM | POA: Diagnosis not present

## 2018-03-18 DIAGNOSIS — E119 Type 2 diabetes mellitus without complications: Secondary | ICD-10-CM | POA: Diagnosis not present

## 2018-03-18 DIAGNOSIS — I10 Essential (primary) hypertension: Secondary | ICD-10-CM | POA: Diagnosis not present

## 2018-03-18 DIAGNOSIS — M79605 Pain in left leg: Secondary | ICD-10-CM | POA: Diagnosis not present

## 2018-03-18 DIAGNOSIS — M7918 Myalgia, other site: Secondary | ICD-10-CM | POA: Diagnosis not present

## 2018-03-18 DIAGNOSIS — F3289 Other specified depressive episodes: Secondary | ICD-10-CM | POA: Diagnosis not present

## 2018-03-20 DIAGNOSIS — L7211 Pilar cyst: Secondary | ICD-10-CM | POA: Diagnosis not present

## 2018-03-20 DIAGNOSIS — D485 Neoplasm of uncertain behavior of skin: Secondary | ICD-10-CM | POA: Diagnosis not present

## 2018-04-07 DIAGNOSIS — Z4802 Encounter for removal of sutures: Secondary | ICD-10-CM | POA: Diagnosis not present

## 2018-04-08 DIAGNOSIS — Z1231 Encounter for screening mammogram for malignant neoplasm of breast: Secondary | ICD-10-CM | POA: Diagnosis not present

## 2018-05-13 DIAGNOSIS — R5383 Other fatigue: Secondary | ICD-10-CM | POA: Diagnosis not present

## 2018-05-16 DIAGNOSIS — R5383 Other fatigue: Secondary | ICD-10-CM | POA: Diagnosis not present

## 2018-05-16 DIAGNOSIS — E041 Nontoxic single thyroid nodule: Secondary | ICD-10-CM | POA: Diagnosis not present

## 2018-06-23 DIAGNOSIS — I10 Essential (primary) hypertension: Secondary | ICD-10-CM | POA: Diagnosis not present

## 2018-06-23 DIAGNOSIS — J029 Acute pharyngitis, unspecified: Secondary | ICD-10-CM | POA: Diagnosis not present

## 2018-06-23 DIAGNOSIS — F324 Major depressive disorder, single episode, in partial remission: Secondary | ICD-10-CM | POA: Diagnosis not present

## 2018-12-23 DIAGNOSIS — R7303 Prediabetes: Secondary | ICD-10-CM | POA: Diagnosis not present

## 2018-12-23 DIAGNOSIS — Z1389 Encounter for screening for other disorder: Secondary | ICD-10-CM | POA: Diagnosis not present

## 2018-12-23 DIAGNOSIS — I209 Angina pectoris, unspecified: Secondary | ICD-10-CM | POA: Diagnosis not present

## 2018-12-23 DIAGNOSIS — F324 Major depressive disorder, single episode, in partial remission: Secondary | ICD-10-CM | POA: Diagnosis not present

## 2018-12-23 DIAGNOSIS — I10 Essential (primary) hypertension: Secondary | ICD-10-CM | POA: Diagnosis not present

## 2018-12-23 DIAGNOSIS — Z0000 Encounter for general adult medical examination without abnormal findings: Secondary | ICD-10-CM | POA: Diagnosis not present

## 2019-01-01 DIAGNOSIS — I209 Angina pectoris, unspecified: Secondary | ICD-10-CM | POA: Diagnosis not present

## 2019-01-29 ENCOUNTER — Other Ambulatory Visit: Payer: Self-pay

## 2019-02-23 DIAGNOSIS — L570 Actinic keratosis: Secondary | ICD-10-CM | POA: Diagnosis not present

## 2019-02-23 DIAGNOSIS — L821 Other seborrheic keratosis: Secondary | ICD-10-CM | POA: Diagnosis not present

## 2019-02-23 DIAGNOSIS — Z85828 Personal history of other malignant neoplasm of skin: Secondary | ICD-10-CM | POA: Diagnosis not present

## 2019-02-23 DIAGNOSIS — L819 Disorder of pigmentation, unspecified: Secondary | ICD-10-CM | POA: Diagnosis not present

## 2019-02-23 DIAGNOSIS — D239 Other benign neoplasm of skin, unspecified: Secondary | ICD-10-CM | POA: Diagnosis not present

## 2019-03-03 DIAGNOSIS — M85852 Other specified disorders of bone density and structure, left thigh: Secondary | ICD-10-CM | POA: Diagnosis not present

## 2019-03-03 DIAGNOSIS — M8588 Other specified disorders of bone density and structure, other site: Secondary | ICD-10-CM | POA: Diagnosis not present

## 2019-03-03 DIAGNOSIS — M8589 Other specified disorders of bone density and structure, multiple sites: Secondary | ICD-10-CM | POA: Diagnosis not present

## 2019-03-03 DIAGNOSIS — M810 Age-related osteoporosis without current pathological fracture: Secondary | ICD-10-CM | POA: Diagnosis not present

## 2019-03-25 DIAGNOSIS — M7061 Trochanteric bursitis, right hip: Secondary | ICD-10-CM | POA: Diagnosis not present

## 2019-03-25 DIAGNOSIS — I10 Essential (primary) hypertension: Secondary | ICD-10-CM | POA: Diagnosis not present

## 2019-04-09 DIAGNOSIS — R5383 Other fatigue: Secondary | ICD-10-CM | POA: Diagnosis not present

## 2019-04-09 DIAGNOSIS — R0602 Shortness of breath: Secondary | ICD-10-CM | POA: Diagnosis not present

## 2019-04-09 DIAGNOSIS — R05 Cough: Secondary | ICD-10-CM | POA: Diagnosis not present

## 2019-04-10 DIAGNOSIS — J320 Chronic maxillary sinusitis: Secondary | ICD-10-CM | POA: Diagnosis not present

## 2019-04-10 DIAGNOSIS — R0981 Nasal congestion: Secondary | ICD-10-CM | POA: Diagnosis not present

## 2019-04-10 DIAGNOSIS — R52 Pain, unspecified: Secondary | ICD-10-CM | POA: Diagnosis not present

## 2019-04-10 DIAGNOSIS — J069 Acute upper respiratory infection, unspecified: Secondary | ICD-10-CM | POA: Diagnosis not present

## 2019-04-22 DIAGNOSIS — J203 Acute bronchitis due to coxsackievirus: Secondary | ICD-10-CM | POA: Diagnosis not present

## 2019-04-25 DIAGNOSIS — Z1159 Encounter for screening for other viral diseases: Secondary | ICD-10-CM | POA: Diagnosis not present

## 2019-04-25 DIAGNOSIS — Z20828 Contact with and (suspected) exposure to other viral communicable diseases: Secondary | ICD-10-CM | POA: Diagnosis not present

## 2019-04-25 DIAGNOSIS — J069 Acute upper respiratory infection, unspecified: Secondary | ICD-10-CM | POA: Diagnosis not present
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
## 2019-05-08 DIAGNOSIS — R079 Chest pain, unspecified: Secondary | ICD-10-CM | POA: Diagnosis not present

## 2019-05-08 DIAGNOSIS — R05 Cough: Secondary | ICD-10-CM | POA: Diagnosis not present

## 2019-05-08 DIAGNOSIS — R0602 Shortness of breath: Secondary | ICD-10-CM | POA: Diagnosis not present

## 2019-05-12 ENCOUNTER — Ambulatory Visit: Payer: Self-pay

## 2019-05-12 ENCOUNTER — Ambulatory Visit (INDEPENDENT_AMBULATORY_CARE_PROVIDER_SITE_OTHER): Payer: Medicare Other

## 2019-05-12 ENCOUNTER — Ambulatory Visit (INDEPENDENT_AMBULATORY_CARE_PROVIDER_SITE_OTHER): Payer: Medicare Other | Admitting: Rheumatology

## 2019-05-12 ENCOUNTER — Encounter: Payer: Self-pay | Admitting: Rheumatology

## 2019-05-12 ENCOUNTER — Other Ambulatory Visit: Payer: Self-pay

## 2019-05-12 VITALS — BP 132/67 | HR 64 | Resp 16 | Ht 70.5 in | Wt 182.0 lb

## 2019-05-12 DIAGNOSIS — M25561 Pain in right knee: Secondary | ICD-10-CM

## 2019-05-12 DIAGNOSIS — M5030 Other cervical disc degeneration, unspecified cervical region: Secondary | ICD-10-CM

## 2019-05-12 DIAGNOSIS — M79671 Pain in right foot: Secondary | ICD-10-CM

## 2019-05-12 DIAGNOSIS — G8929 Other chronic pain: Secondary | ICD-10-CM | POA: Diagnosis not present

## 2019-05-12 DIAGNOSIS — R768 Other specified abnormal immunological findings in serum: Secondary | ICD-10-CM | POA: Diagnosis not present

## 2019-05-12 DIAGNOSIS — M5136 Other intervertebral disc degeneration, lumbar region: Secondary | ICD-10-CM | POA: Diagnosis not present

## 2019-05-12 DIAGNOSIS — E785 Hyperlipidemia, unspecified: Secondary | ICD-10-CM | POA: Diagnosis not present

## 2019-05-12 DIAGNOSIS — M25551 Pain in right hip: Secondary | ICD-10-CM

## 2019-05-12 DIAGNOSIS — M25552 Pain in left hip: Secondary | ICD-10-CM

## 2019-05-12 DIAGNOSIS — Z8659 Personal history of other mental and behavioral disorders: Secondary | ICD-10-CM

## 2019-05-12 DIAGNOSIS — R5383 Other fatigue: Secondary | ICD-10-CM | POA: Diagnosis not present

## 2019-05-12 DIAGNOSIS — M7061 Trochanteric bursitis, right hip: Secondary | ICD-10-CM

## 2019-05-12 DIAGNOSIS — M79672 Pain in left foot: Secondary | ICD-10-CM

## 2019-05-12 DIAGNOSIS — M79642 Pain in left hand: Secondary | ICD-10-CM

## 2019-05-12 DIAGNOSIS — M79641 Pain in right hand: Secondary | ICD-10-CM | POA: Diagnosis not present

## 2019-05-12 DIAGNOSIS — M25562 Pain in left knee: Secondary | ICD-10-CM

## 2019-05-12 DIAGNOSIS — Z8261 Family history of arthritis: Secondary | ICD-10-CM

## 2019-05-12 DIAGNOSIS — M720 Palmar fascial fibromatosis [Dupuytren]: Secondary | ICD-10-CM

## 2019-05-12 DIAGNOSIS — I10 Essential (primary) hypertension: Secondary | ICD-10-CM | POA: Diagnosis not present

## 2019-05-13 NOTE — Progress Notes (Signed)
I will discuss results at the follow-up visit.

## 2019-05-16 LAB — CBC WITH DIFFERENTIAL/PLATELET
Absolute Monocytes: 473 cells/uL (ref 200–950)
Basophils Absolute: 68 cells/uL (ref 0–200)
Basophils Relative: 0.9 %
Eosinophils Absolute: 248 cells/uL (ref 15–500)
Eosinophils Relative: 3.3 %
HCT: 40.7 % (ref 35.0–45.0)
Hemoglobin: 13.5 g/dL (ref 11.7–15.5)
Lymphs Abs: 2438 cells/uL (ref 850–3900)
MCH: 30.1 pg (ref 27.0–33.0)
MCHC: 33.2 g/dL (ref 32.0–36.0)
MCV: 90.6 fL (ref 80.0–100.0)
MPV: 10.8 fL (ref 7.5–12.5)
Monocytes Relative: 6.3 %
Neutro Abs: 4275 cells/uL (ref 1500–7800)
Neutrophils Relative %: 57 %
Platelets: 289 10*3/uL (ref 140–400)
RBC: 4.49 10*6/uL (ref 3.80–5.10)
RDW: 12.7 % (ref 11.0–15.0)
Total Lymphocyte: 32.5 %
WBC: 7.5 10*3/uL (ref 3.8–10.8)

## 2019-05-16 LAB — COMPLETE METABOLIC PANEL WITH GFR
AG Ratio: 2.3 (calc) (ref 1.0–2.5)
ALT: 17 U/L (ref 6–29)
AST: 16 U/L (ref 10–35)
Albumin: 4.5 g/dL (ref 3.6–5.1)
Alkaline phosphatase (APISO): 69 U/L (ref 37–153)
BUN: 15 mg/dL (ref 7–25)
CO2: 30 mmol/L (ref 20–32)
Calcium: 9.9 mg/dL (ref 8.6–10.4)
Chloride: 104 mmol/L (ref 98–110)
Creat: 0.65 mg/dL (ref 0.50–0.99)
GFR, Est African American: 106 mL/min/{1.73_m2} (ref >=60)
GFR, Est Non African American: 92 mL/min/{1.73_m2} (ref >=60)
Globulin: 2 g/dL (calc) (ref 1.9–3.7)
Glucose, Bld: 155 mg/dL — ABNORMAL HIGH (ref 65–99)
Potassium: 3.8 mmol/L (ref 3.5–5.3)
Sodium: 143 mmol/L (ref 135–146)
Total Bilirubin: 0.4 mg/dL (ref 0.2–1.2)
Total Protein: 6.5 g/dL (ref 6.1–8.1)

## 2019-05-16 LAB — C3 AND C4
C3 Complement: 127 mg/dL (ref 83–193)
C4 Complement: 35 mg/dL (ref 15–57)

## 2019-05-16 LAB — CK: Total CK: 93 U/L (ref 29–143)

## 2019-05-16 LAB — TSH: TSH: 0.7 mIU/L (ref 0.40–4.50)

## 2019-05-16 LAB — SJOGRENS SYNDROME-B EXTRACTABLE NUCLEAR ANTIBODY: SSB (La) (ENA) Antibody, IgG: <1 AI

## 2019-05-16 LAB — 14-3-3 ETA PROTEIN: 14-3-3 eta Protein: <0.2 ng/mL (ref <0.2)

## 2019-05-16 LAB — SJOGRENS SYNDROME-A EXTRACTABLE NUCLEAR ANTIBODY: SSA (Ro) (ENA) Antibody, IgG: <1 AI

## 2019-05-16 LAB — ANTI-DNA ANTIBODY, DOUBLE-STRANDED: ds DNA Ab: <1 IU/mL

## 2019-05-16 LAB — ANTI-SCLERODERMA ANTIBODY: Scleroderma (Scl-70) (ENA) Antibody, IgG: <1 AI

## 2019-05-16 LAB — URIC ACID: Uric Acid, Serum: 3.7 mg/dL (ref 2.5–7.0)

## 2019-05-16 LAB — ANTI-SMITH ANTIBODY: ENA SM Ab Ser-aCnc: <1 AI

## 2019-05-16 LAB — RNP ANTIBODY: Ribonucleic Protein(ENA) Antibody, IgG: <1 AI

## 2019-05-22 NOTE — Progress Notes (Signed)
Virtual Visit via Telephone Note  I connected with Cassandra Mccullough on 06/03/19 at  3:00 PM EST by telephone and verified that I am speaking with the correct person using two identifiers.  Location: Patient: Home  Provider: Clinic  This service was conducted via virtual visit.  The patient was located at home. I was located in my office.  Consent was obtained prior to the virtual visit and is aware of possible charges through their insurance for this visit.  The patient is an established patient.  Dr. Estanislado Pandy, MD conducted the virtual visit and Hazel Sams, PA-C acted as scribe during the service.  Office staff helped with scheduling follow up visits after the service was conducted.   I discussed the limitations, risks, security and privacy concerns of performing an evaluation and management service by telephone and the availability of in person appointments. I also discussed with the patient that there may be a patient responsible charge related to this service. The patient expressed understanding and agreed to proceed.  CC: Discuss lab work  History of Present Illness: Patient is a 68 year old female with a past medical history of positive ANA, osteoarthritis, and DDD.  She continues to have severe pain in both hip joints, especially the left hip joint.  She has right trochanteric bursitis.  She has chronic pain in both knee joints but denies joint swelling.  She has chronic neck and lower back pain and stiffness. She denies any clinical features of autoimmune disease.   Review of Systems  Constitutional: Negative for fever and malaise/fatigue.  Eyes: Negative for photophobia, pain, discharge and redness.       +Dry eyes  Respiratory: Negative for cough, shortness of breath and wheezing.   Cardiovascular: Negative for chest pain and palpitations.  Gastrointestinal: Negative for blood in stool, constipation and diarrhea.  Genitourinary: Negative for dysuria.  Musculoskeletal: Positive  for back pain, joint pain and neck pain. Negative for myalgias.  Skin: Negative for rash.  Neurological: Negative for dizziness and headaches.  Psychiatric/Behavioral: Negative for depression. The patient is not nervous/anxious and does not have insomnia.       Observations/Objective: Physical Exam  Constitutional: She is oriented to person, place, and time.  Neurological: She is alert and oriented to person, place, and time.  Psychiatric: Mood, memory, affect and judgment normal.   Patient reports morning stiffness for 1 hour.   Patient reports nocturnal pain.  Difficulty dressing/grooming: Denies Difficulty climbing stairs: Reports Difficulty getting out of chair: Reports Difficulty using hands for taps, buttons, cutlery, and/or writing: Denies   Assessment and Plan: Positive ANA (antinuclear antibody):  ANA1:80 speckled, ENA negative, C3-C4 normal: Patient had no clinical features of autoimmune disease. Lab work from 05/12/19 was reviewed with the patient and all questions were addressed.  She was advised to notify us if she develops any new or worsening symptoms.    Primary osteoarthritis of both hands: Mild: She has no joint swelling at this time.  Joint protection and muscle strengthening were discussed.   Dupuytren's contracture of left hand - Left ring finger  Chronic pain of both hips -X-ray of bilateral hip joints and SI joints were unremarkable.  She continues to have persistent pain in both hip joints. She has groin pain bilaterally, which is exacerbated by sitting for prolonged period of time.  She states the pain is severe and she occasionally experiences weakness in bilateral lower extremities.  The pain is more severe on the left side.  We will  proceed with scheduling a MRI of the left hip joint. She declined physical therapy at this time.   Trochanteric bursitis, right hip: She has persistent discomfort. She experiences nocturnal pain when laying on her right side  at night. She declined physical therapy at this time.  Primary osteoarthritis of both knees: Moderate OA and moderate chondromalacia patella: She has chronic pain in both knee joints.  She had stiffness and severe pain after sitting for prolonged periods of time.   Primary osteoarthritis of both feet: She denies any feet pain or joint swelling at this time.  DDD (degenerative disc disease), cervical - Status post discectomy.  She has chronic discomfort.  DDD (degenerative disc disease), lumbar - status post fusion.  Chronic pain  Other medical conditions are listed as follows:   Essential hypertension  Dyslipidemia  History of depression  Family history of rheumatoid arthritis - Sister    Follow Up Instructions: She will follow up in 2 months    I discussed the assessment and treatment plan with the patient. The patient was provided an opportunity to ask questions and all were answered. The patient agreed with the plan and demonstrated an understanding of the instructions.   The patient was advised to call back or seek an in-person evaluation if the symptoms worsen or if the condition fails to improve as anticipated.  I provided 25 minutes of non-face-to-face time during this encounter.   Bo Merino, MD   Scribed by-  Hazel Sams, PA-C

## 2019-06-03 ENCOUNTER — Other Ambulatory Visit: Payer: Self-pay

## 2019-06-03 ENCOUNTER — Telehealth (INDEPENDENT_AMBULATORY_CARE_PROVIDER_SITE_OTHER): Payer: Medicare Other | Admitting: Rheumatology

## 2019-06-03 ENCOUNTER — Encounter: Payer: Self-pay | Admitting: Rheumatology

## 2019-06-03 DIAGNOSIS — M19042 Primary osteoarthritis, left hand: Secondary | ICD-10-CM

## 2019-06-03 DIAGNOSIS — Z8659 Personal history of other mental and behavioral disorders: Secondary | ICD-10-CM

## 2019-06-03 DIAGNOSIS — R768 Other specified abnormal immunological findings in serum: Secondary | ICD-10-CM | POA: Diagnosis not present

## 2019-06-03 DIAGNOSIS — M170 Bilateral primary osteoarthritis of knee: Secondary | ICD-10-CM

## 2019-06-03 DIAGNOSIS — M5136 Other intervertebral disc degeneration, lumbar region: Secondary | ICD-10-CM

## 2019-06-03 DIAGNOSIS — I10 Essential (primary) hypertension: Secondary | ICD-10-CM | POA: Diagnosis not present

## 2019-06-03 DIAGNOSIS — E785 Hyperlipidemia, unspecified: Secondary | ICD-10-CM | POA: Diagnosis not present

## 2019-06-03 DIAGNOSIS — Z8261 Family history of arthritis: Secondary | ICD-10-CM | POA: Diagnosis not present

## 2019-06-03 DIAGNOSIS — M19041 Primary osteoarthritis, right hand: Secondary | ICD-10-CM

## 2019-06-03 DIAGNOSIS — M5030 Other cervical disc degeneration, unspecified cervical region: Secondary | ICD-10-CM | POA: Diagnosis not present

## 2019-06-03 DIAGNOSIS — M19072 Primary osteoarthritis, left ankle and foot: Secondary | ICD-10-CM

## 2019-06-03 DIAGNOSIS — M19071 Primary osteoarthritis, right ankle and foot: Secondary | ICD-10-CM | POA: Diagnosis not present

## 2019-06-03 DIAGNOSIS — M720 Palmar fascial fibromatosis [Dupuytren]: Secondary | ICD-10-CM

## 2019-06-03 DIAGNOSIS — M7061 Trochanteric bursitis, right hip: Secondary | ICD-10-CM | POA: Diagnosis not present

## 2019-06-04 ENCOUNTER — Other Ambulatory Visit: Payer: Self-pay | Admitting: *Deleted

## 2019-06-04 DIAGNOSIS — M25552 Pain in left hip: Secondary | ICD-10-CM

## 2019-06-17 ENCOUNTER — Telehealth: Payer: Self-pay | Admitting: Rheumatology

## 2019-06-17 NOTE — Telephone Encounter (Signed)
-----   Message from Shona Needles, [place] sent at 06/04/2019  4:02 PM EST ----- Regarding: F/U IN 2 MONTHS

## 2019-06-17 NOTE — Telephone Encounter (Signed)
I LMOM for patient to call, and schedule a follow up appointment around 08/04/2019.

## 2019-06-22 ENCOUNTER — Ambulatory Visit (HOSPITAL_COMMUNITY)
Admission: RE | Admit: 2019-06-22 | Discharge: 2019-06-22 | Disposition: A | Discharge status: Home / Self Care | Payer: Medicare Other | Source: Ambulatory Visit | Attending: Rheumatology | Admitting: Rheumatology

## 2019-06-22 ENCOUNTER — Other Ambulatory Visit: Payer: Self-pay

## 2019-06-22 DIAGNOSIS — M25552 Pain in left hip: Secondary | ICD-10-CM | POA: Diagnosis not present

## 2019-06-23 ENCOUNTER — Other Ambulatory Visit: Payer: Self-pay

## 2019-06-23 ENCOUNTER — Telehealth: Payer: Self-pay | Admitting: Rheumatology

## 2019-06-23 DIAGNOSIS — M25552 Pain in left hip: Secondary | ICD-10-CM

## 2019-06-23 NOTE — Progress Notes (Signed)
MRI results are consistent with trochanteric bursitis.  I left a message on the answering machine for patient to call back.  If she is interested we can schedule trochanteric bursa injection by Dr. Ernestina Patches under fluoroscopy.

## 2019-06-23 NOTE — Telephone Encounter (Signed)
Patient returned your call.

## 2019-06-23 NOTE — Telephone Encounter (Signed)
Advised patient MRI results are consistent with trochanteric bursitis. Patient verbalized understanding and requested the referral be placed for Dr. Ernestina Patches. Referral placed, [facility] will call to schedule appointment.

## 2019-07-17 ENCOUNTER — Ambulatory Visit: Payer: Self-pay

## 2019-07-17 ENCOUNTER — Other Ambulatory Visit: Payer: Self-pay

## 2019-07-17 ENCOUNTER — Encounter: Payer: Self-pay | Admitting: Physical Medicine and Rehabilitation

## 2019-07-17 ENCOUNTER — Ambulatory Visit (INDEPENDENT_AMBULATORY_CARE_PROVIDER_SITE_OTHER): Payer: Medicare Other | Admitting: Physical Medicine and Rehabilitation

## 2019-07-17 DIAGNOSIS — M25552 Pain in left hip: Secondary | ICD-10-CM

## 2019-07-17 NOTE — Progress Notes (Signed)
  Numeric Pain Rating Scale and Functional Assessment Average Pain 7   In the last MONTH (on 0-10 scale) has pain interfered with the following?  1. General activity like being  able to carry out your everyday physical activities such as walking, climbing stairs, carrying groceries, or moving a chair?  Rating(10)    -Dye Allergies.

## 2019-07-20 DIAGNOSIS — M25552 Pain in left hip: Secondary | ICD-10-CM

## 2019-07-20 MED ORDER — LIDOCAINE HCL 2 % IJ SOLN
4.0000 mL | INTRAMUSCULAR | Status: AC | PRN
  Administered 2019-07-20: 06:00:00 4 mL

## 2019-07-20 MED ORDER — TRIAMCINOLONE ACETONIDE 40 MG/ML IJ SUSP
40.0000 mg | INTRAMUSCULAR | Status: AC | PRN
  Administered 2019-07-20: 40 mg via INTRA_ARTICULAR

## 2019-07-20 MED ORDER — BUPIVACAINE HCL 0.25 % IJ SOLN
4.0000 mL | INTRAMUSCULAR | Status: AC | PRN
  Administered 2019-07-20: 4 mL via INTRA_ARTICULAR

## 2019-07-20 NOTE — Progress Notes (Signed)
Cassandra Mccullough - 69 y.o. female MRN [id number]  Date of birth: [birth year]  Office Visit Note: Visit Date: 07/17/2019 PCP: Neale Burly, MD Referred by: Neale Burly, MD  Subjective: Chief Complaint  Patient presents with  . Left Hip - Pain   HPI: Cassandra Mccullough is a 69 y.o. female who comes in today At the request of Dr. Cy Blamer for left greater trochanteric bursa injection and gluteus medius tendon injection under fluoroscopic guidance.  Patient is having chronic worsening severe left lateral hip pain.  She has MRI of the hip showing trochanteric bursitis and some tendinitis of the gluteus medius insertion.  She is failed all manner of conservative care otherwise.  ROS Otherwise per HPI.  Assessment & Plan: Visit Diagnoses:  1. Pain in left hip     Plan: No additional findings.   Meds & Orders: No orders of the defined types were placed in this encounter.   Orders Placed This Encounter  Procedures  . Large Joint Inj: L greater trochanter  . XR C-ARM NO REPORT    Follow-up: Return if symptoms worsen or fail to improve.   Procedures: Large Joint Inj: L greater trochanter on 07/20/2019 5:31 AM Indications: pain and diagnostic evaluation Details: 22 G 3.5 in needle, lateral approach  Arthrogram: No  Medications: 4 mL lidocaine 2 %; 4 mL bupivacaine 0.25 %; 40 mg triamcinolone acetonide 40 MG/ML Outcome: tolerated well, no immediate complications  Greatest area of pain over the greater trochanter was palpated and marked prior to injection. The patient did seem to have relief after the injection. Procedure, treatment alternatives, risks and benefits explained, specific risks discussed. Consent was given by the patient. Immediately prior to procedure a time out was called to verify the correct patient, procedure, equipment, support staff and site/side marked as required. Patient was prepped and draped in the usual sterile fashion.      No notes on file     Clinical History: MR OF THE LEFT HIP WITHOUT CONTRAST  TECHNIQUE: Multiplanar, multisequence MR imaging was performed. No intravenous contrast was administered.  COMPARISON:  Radiographs dated 05/12/2019  FINDINGS: Bones: There is a prominent osteophyte on the anterior aspect of the left greater trochanter at the insertion of the left gluteus minimus tendon. The bones of the left hip and pelvis are otherwise normal.  Articular cartilage and labrum  Articular cartilage:  Normal.  Labrum:  Normal.  Joint or bursal effusion  Joint effusion:  No effusion.  Bursae: Slight edema in the left greater trochanteric bursa adjacent to the degenerative changes in the distal gluteus medius tendon.  Muscles and tendons  Muscles and tendons: Focal inflammation around the insertion of the left gluteus medius tendon on the lateral aspect of the left greater trochanter.  Other findings  Miscellaneous: No adenopathy. Slight diverticulosis of the sigmoid portion of the colon.  IMPRESSION: 1. Focal inflammation of the left greater trochanteric bursa adjacent to the insertion of the left gluteus medius tendon. 2. Slight inflammation of the left gluteus medius tendon at the insertion on the lateral aspect of the left greater trochanter. 3. Otherwise, normal exam.   Electronically Signed   By: Lorriane Shire M.D.   On: 06/22/2019 14:30   She reports that she quit smoking about 18 years ago. Her smoking use included cigarettes. She has a 30.00 pack-year smoking history. She has never used smokeless tobacco.  Recent Labs    05/12/19 0932  LABURIC 3.7    Objective:  VS:  HT:    WT:   BMI:     BP:   HR: bpm  TEMP: ( )  RESP:  Physical Exam Constitutional:      General: She is not in acute distress.    Appearance: Normal appearance. She is not ill-appearing.  HENT:     Head: Normocephalic and atraumatic.     Right Ear: External ear normal.     Left Ear:  External ear normal.  Eyes:     Extraocular Movements: Extraocular movements intact.  Cardiovascular:     Rate and Rhythm: Normal rate.     Pulses: Normal pulses.  Musculoskeletal:     Right lower leg: No edema.     Left lower leg: No edema.     Comments: He has concordant pain over the left greater trochanter but not the right.  No internal rotation pain or groin pain.  Good distal strength..  No clonus or focal weakness.  Skin:    Findings: No erythema, lesion or rash.  Neurological:     General: No focal deficit present.     Mental Status: She is alert and oriented to person, place, and time.     Sensory: No sensory deficit.     Motor: No weakness or abnormal muscle tone.     Coordination: Coordination normal.  Psychiatric:        Mood and Affect: Mood normal.        Behavior: Behavior normal.     Ortho Exam Imaging: No results found.  Past Medical/Family/Surgical/Social History: Medications & Allergies reviewed per EMR, new medications updated. Patient Active Problem List   Diagnosis Date Noted  . SHORTNESS OF BREATH 04/04/2010  . CHEST PAIN 04/04/2010  . DYSLIPIDEMIA 04/03/2010  . HYPERTENSION 04/03/2010  . BRADYCARDIA 04/03/2010   Past Medical History:  Diagnosis Date  . Blister of heel 05/2012   fracture blisters right heel  . Calcaneus fracture, right 04/29/2012  . Chronic back pain   . Dental crowns present   . Depression   . Diabetes mellitus without complication ([place])   . Dyslipidemia   . Fibromyalgia   . Fibromyalgia 10/04/2015  . Heartburn    occasional - OTC as needed  . History of GI bleed   . Hypertension    under control, has been on med. x 5-6 yrs.  . OSA (obstructive sleep apnea) 10/04/2015  . PONV (postoperative nausea and vomiting)   . Wears partial dentures    upper and lower   Family History  Problem Relation Age of Onset  . Prostate cancer Father   . Colon cancer Mother   . Rheum arthritis Sister   . Osteoporosis Sister   . Breast  cancer Sister    Past Surgical History:  Procedure Laterality Date  . ABDOMINAL HYSTERECTOMY     partial  . ANTERIOR CERVICAL DISCECTOMY  07/18/1998   C5-6, C6-7 with arthrodesis  . APPENDECTOMY    . BILATERAL OOPHORECTOMY     with bilat. salpingectomy  . FOOT SURGERY     right  . LEFT HEART CATH AND CORONARY ANGIOGRAPHY N/A 02/01/2017   Procedure: Left Heart Cath and Coronary Angiography;  Surgeon: Troy Sine, MD;  Location: [facility];  Service: Cardiovascular;  Laterality: N/A;  . ORIF CALCANEOUS FRACTURE  05/14/2012   Procedure: OPEN REDUCTION INTERNAL FIXATION (ORIF) CALCANEOUS FRACTURE;  Surgeon: Jolyn Nap, MD;  Location: [facility];  Service: Orthopedics;  Laterality: Right;  . POSTERIOR  LUMBAR FUSION  10/04/2004  . TRACHEOSTOMY    . TYMPANOPLASTY     Social History   Occupational History  . Not on file  Tobacco Use  . Smoking status: Former Smoker    Packs/day: 1.00    Years: 30.00    Pack years: 30.00    Types: Cigarettes    Quit date: 07/02/2001    Years since quitting: 18.0  . Smokeless tobacco: Never Used  . Tobacco comment: quit smoking > 10 yrs. ago  Substance and Sexual Activity  . Alcohol use: No    Alcohol/week: 0.0 standard drinks  . Drug use: No  . Sexual activity: Not on file

## 2019-07-21 DIAGNOSIS — H40013 Open angle with borderline findings, low risk, bilateral: Secondary | ICD-10-CM | POA: Diagnosis not present

## 2019-08-10 NOTE — Progress Notes (Signed)
Virtual Visit via Telephone Note  I connected with Cassandra Mccullough on 08/13/19 at  4:00 PM EST by telephone and verified that I am speaking with the correct person using two identifiers.  Location: Patient: Home  Provider: Clinic  This service was conducted via virtual visit. The patient was located at home. I was located in my office.  Consent was obtained prior to the virtual visit and is aware of possible charges through their insurance for this visit.  The patient is an established patient.  Dr. Estanislado Pandy, MD conducted the virtual visit and Hazel Sams, PA-C acted as scribe during the service.  Office staff helped with scheduling follow up visits after the service was conducted.   I discussed the limitations, risks, security and privacy concerns of performing an evaluation and management service by telephone and the availability of in person appointments. I also discussed with the patient that there may be a patient responsible charge related to this service. The patient expressed understanding and agreed to proceed.  CC: left trochanteric bursitis  History of Present Illness: Patient is a 69 year old female with a past medical history of positive ANA and osteoarthritis. She had a left hip joint MRI on 06/22/19 and was referred to Dr. Ernestina Patches for a left trochanteric bursa cortisone injection.  She denies pain in both hands but does have frequent hand cramping.  She denies any recent rashes, photosensitivity, hair loss, oral or nasal ulcerations, or symptoms of Raynaud's.   She has chronic eye dryness but no mouth dryness.   Review of Systems  Constitutional: Positive for malaise/fatigue. Negative for fever.  HENT:       Denies oral or nasal ulcerations   Eyes: Negative for photophobia, pain, discharge and redness.       +Dry eyes  Respiratory: Negative for cough and wheezing.   Cardiovascular: Positive for leg swelling. Negative for chest pain and palpitations.  Gastrointestinal:  Negative for blood in stool, constipation and diarrhea.  Genitourinary: Negative for dysuria.  Musculoskeletal: Positive for joint pain. Negative for back pain, myalgias and neck pain.  Skin: Negative for rash.       Denies photosensitivity  Denies symptoms of Raynaud's  Neurological: Negative for dizziness, weakness and headaches.  Psychiatric/Behavioral: Positive for memory loss. Negative for depression. The patient is not nervous/anxious and does not have insomnia.       Observations/Objective: Physical Exam  Constitutional: She is oriented to person, place, and time.  Neurological: She is alert and oriented to person, place, and time.  Psychiatric: Mood, memory, affect and judgment normal.   Patient reports morning stiffness for 5 minutes.   Patient denies nocturnal pain.  Difficulty dressing/grooming: Denies Difficulty climbing stairs: Denies Difficulty getting out of chair: Denies Difficulty using hands for taps, buttons, cutlery, and/or writing: Denies   Assessment and Plan: Positive ANA (antinuclear antibody):  ANA 1:80 speckled, ENA negative, C3-C4 normal: She has no clinical features of autoimmune disease at this time.  She was advised to notify us if she develops any new or worsening symptoms.  She will follow up in 4 months.  Primary osteoarthritis of both hands: Mild: She is not having any joint pain or joint inflammation at this time. She experiences hand cramps on a regular basis.  Joint protection and muscle strengthening were discussed.   Dupuytren's contracture of left hand -Bilateral ring finger  Chronic pain of both hips -X-ray of bilateral hip joints and SI joints were unremarkable.  MRI of the left hip  joint was obtained on 06/22/19.  There was no sign of arthritis or joint effusion.  Trochanteric bursitis, left hip: She had a MRI of the left hip joint on 06/22/19, which revealed focal inflammation of the left trochanteric bursa.  She had a right  trochanteric bursa cortisone injection on 07/17/19 performed by Dr. Ernestina Patches. She states her pain has improved significant.  She is not having any difficulty with ambulation at this time. We discussed that she can use voltaren gel topically as needed for pain relief. She was advised to notify us if she would like a referral to physical therapy in the future.   Primary osteoarthritis of both knees: Moderate OA and moderate chondromalacia patella: She is not having any knee joint pain or joint inflammation at this time.   Primary osteoarthritis of both feet: She has chronic pain in both feet but no inflammation.  DDD (degenerative disc disease), cervical - Status post discectomy. She has chronic discomfort.  DDD (degenerative disc disease), lumbar - status post fusion. Chronic pain  Other medical conditions are listed as follows:   Essential hypertension  Dyslipidemia  History of depression  Family history of rheumatoid arthritis - Sister  Follow Up Instructions: She will follow up in 4 months.   I discussed the assessment and treatment plan with the patient. The patient was provided an opportunity to ask questions and all were answered. The patient agreed with the plan and demonstrated an understanding of the instructions.   The patient was advised to call back or seek an in-person evaluation if the symptoms worsen or if the condition fails to improve as anticipated.  I provided 20 minutes of non-face-to-face time during this encounter.   Bo Merino, MD   I examined and evaluated the patient with Hazel Sams PA. The plan of care was discussed as noted above.  Bo Merino, MD

## 2019-08-13 ENCOUNTER — Other Ambulatory Visit: Payer: Self-pay

## 2019-08-13 ENCOUNTER — Encounter: Payer: Self-pay | Admitting: Rheumatology

## 2019-08-13 ENCOUNTER — Telehealth (INDEPENDENT_AMBULATORY_CARE_PROVIDER_SITE_OTHER): Payer: Medicare Other | Admitting: Rheumatology

## 2019-08-13 DIAGNOSIS — M5136 Other intervertebral disc degeneration, lumbar region: Secondary | ICD-10-CM | POA: Diagnosis not present

## 2019-08-13 DIAGNOSIS — Z8659 Personal history of other mental and behavioral disorders: Secondary | ICD-10-CM | POA: Diagnosis not present

## 2019-08-13 DIAGNOSIS — M720 Palmar fascial fibromatosis [Dupuytren]: Secondary | ICD-10-CM | POA: Diagnosis not present

## 2019-08-13 DIAGNOSIS — R768 Other specified abnormal immunological findings in serum: Secondary | ICD-10-CM | POA: Diagnosis not present

## 2019-08-13 DIAGNOSIS — M19041 Primary osteoarthritis, right hand: Secondary | ICD-10-CM | POA: Diagnosis not present

## 2019-08-13 DIAGNOSIS — M5030 Other cervical disc degeneration, unspecified cervical region: Secondary | ICD-10-CM | POA: Diagnosis not present

## 2019-08-13 DIAGNOSIS — M7062 Trochanteric bursitis, left hip: Secondary | ICD-10-CM

## 2019-08-13 DIAGNOSIS — M19042 Primary osteoarthritis, left hand: Secondary | ICD-10-CM

## 2019-08-13 DIAGNOSIS — I10 Essential (primary) hypertension: Secondary | ICD-10-CM | POA: Diagnosis not present

## 2019-08-13 DIAGNOSIS — M170 Bilateral primary osteoarthritis of knee: Secondary | ICD-10-CM

## 2019-08-13 DIAGNOSIS — M19071 Primary osteoarthritis, right ankle and foot: Secondary | ICD-10-CM

## 2019-08-13 DIAGNOSIS — E785 Hyperlipidemia, unspecified: Secondary | ICD-10-CM | POA: Diagnosis not present

## 2019-08-13 DIAGNOSIS — M19072 Primary osteoarthritis, left ankle and foot: Secondary | ICD-10-CM

## 2019-08-13 DIAGNOSIS — Z8261 Family history of arthritis: Secondary | ICD-10-CM | POA: Diagnosis not present

## 2019-08-30 ENCOUNTER — Ambulatory Visit: Payer: Medicare Other | Attending: Internal Medicine

## 2019-08-30 DIAGNOSIS — Z23 Encounter for immunization: Secondary | ICD-10-CM | POA: Insufficient documentation

## 2019-08-30 NOTE — Progress Notes (Signed)
   Covid-19 Vaccination Clinic  Name:  Cassandra Mccullough    MRN: [id number] DOB: 1951-01-29  08/30/2019  Cassandra Mccullough was observed post Covid-19 immunization for 15 minutes without incidence. She was provided with Vaccine Information Sheet and instruction to access the V-Safe system.   Cassandra Mccullough was instructed to call 911 with any severe reactions post vaccine: Marland Kitchen Difficulty breathing  . Swelling of your face and throat  . A fast heartbeat  . A bad rash all over your body  . Dizziness and weakness    Immunizations Administered    Name Date Dose VIS Date Route   Moderna COVID-19 Vaccine 08/30/2019  8:52 AM 0.5 mL 06/02/2019 Intramuscular   Manufacturer: Moderna   Lot: [id number]   [id number]

## 2019-09-22 DIAGNOSIS — I10 Essential (primary) hypertension: Secondary | ICD-10-CM | POA: Diagnosis not present

## 2019-09-22 DIAGNOSIS — E119 Type 2 diabetes mellitus without complications: Secondary | ICD-10-CM | POA: Diagnosis not present

## 2019-09-22 DIAGNOSIS — E7849 Other hyperlipidemia: Secondary | ICD-10-CM | POA: Diagnosis not present

## 2019-09-22 DIAGNOSIS — F3289 Other specified depressive episodes: Secondary | ICD-10-CM | POA: Diagnosis not present

## 2019-10-03 ENCOUNTER — Ambulatory Visit: Payer: Medicare Other | Attending: Internal Medicine

## 2019-10-03 DIAGNOSIS — Z23 Encounter for immunization: Secondary | ICD-10-CM

## 2019-10-03 NOTE — Progress Notes (Signed)
   Covid-19 Vaccination Clinic  Name:  Cassandra Mccullough    MRN: [id number] DOB: 11/28/1950  10/03/2019  Cassandra Mccullough was observed post Covid-19 immunization for 15 minutes without incident. She was provided with Vaccine Information Sheet and instruction to access the V-Safe system.    Cassandra Mccullough was instructed to call 911 with any severe reactions post vaccine: Marland Kitchen Difficulty breathing  . Swelling of face and throat  . A fast heartbeat  . A bad rash all over body  . Dizziness and weakness   Immunizations Administered    Name Date Dose VIS Date Route   Moderna COVID-19 Vaccine 10/03/2019  9:19 AM 0.5 mL 06/02/2019 Intramuscular   Manufacturer: Moderna   Lot: [id number]   [id number]

## 2019-11-02 ENCOUNTER — Telehealth: Payer: Self-pay | Admitting: Physical Medicine and Rehabilitation

## 2019-11-02 DIAGNOSIS — M9903 Segmental and somatic dysfunction of lumbar region: Secondary | ICD-10-CM | POA: Diagnosis not present

## 2019-11-02 DIAGNOSIS — M9902 Segmental and somatic dysfunction of thoracic region: Secondary | ICD-10-CM | POA: Diagnosis not present

## 2019-11-02 DIAGNOSIS — M9901 Segmental and somatic dysfunction of cervical region: Secondary | ICD-10-CM | POA: Diagnosis not present

## 2019-11-02 DIAGNOSIS — M545 Low back pain: Secondary | ICD-10-CM | POA: Diagnosis not present

## 2019-11-02 DIAGNOSIS — M542 Cervicalgia: Secondary | ICD-10-CM | POA: Diagnosis not present

## 2019-11-02 NOTE — Telephone Encounter (Signed)
Patient had a left hip injection on 1/15. She reports that her pain is back and she would like to schedule another. Ok to repeat?

## 2019-11-02 NOTE — Telephone Encounter (Signed)
Scheduled for 0845 on 5/6.

## 2019-11-02 NOTE — Telephone Encounter (Signed)
ok 

## 2019-11-05 ENCOUNTER — Encounter: Payer: Self-pay | Admitting: Physical Medicine and Rehabilitation

## 2019-11-05 ENCOUNTER — Ambulatory Visit: Payer: Self-pay

## 2019-11-05 ENCOUNTER — Other Ambulatory Visit: Payer: Self-pay

## 2019-11-05 ENCOUNTER — Ambulatory Visit (INDEPENDENT_AMBULATORY_CARE_PROVIDER_SITE_OTHER): Payer: Medicare Other | Admitting: Physical Medicine and Rehabilitation

## 2019-11-05 DIAGNOSIS — M25552 Pain in left hip: Secondary | ICD-10-CM | POA: Diagnosis not present

## 2019-11-05 NOTE — Progress Notes (Signed)
Pt states pain in the left buttocks and left hip/ bursa (no groin pain). Pt states pain started over a week ago. Pt states last injection helped a lot. Pt states standing and walking makes pain worse. Resting helps with pain.   .Numeric Pain Rating Scale and Functional Assessment Average Pain 8   In the last MONTH (on 0-10 scale) has pain interfered with the following?  1. General activity like being  able to carry out your everyday physical activities such as walking, climbing stairs, carrying groceries, or moving a chair?  Rating(8)    -Dye Allergies.

## 2019-11-06 DIAGNOSIS — M545 Low back pain: Secondary | ICD-10-CM | POA: Diagnosis not present

## 2019-11-06 DIAGNOSIS — M9901 Segmental and somatic dysfunction of cervical region: Secondary | ICD-10-CM | POA: Diagnosis not present

## 2019-11-06 DIAGNOSIS — M9902 Segmental and somatic dysfunction of thoracic region: Secondary | ICD-10-CM | POA: Diagnosis not present

## 2019-11-06 DIAGNOSIS — M542 Cervicalgia: Secondary | ICD-10-CM | POA: Diagnosis not present

## 2019-11-06 DIAGNOSIS — M9903 Segmental and somatic dysfunction of lumbar region: Secondary | ICD-10-CM | POA: Diagnosis not present

## 2019-11-06 MED ORDER — TRIAMCINOLONE ACETONIDE 40 MG/ML IJ SUSP
60.0000 mg | INTRAMUSCULAR | Status: AC | PRN
  Administered 2019-11-05: 60 mg via INTRA_ARTICULAR

## 2019-11-06 MED ORDER — BUPIVACAINE HCL 0.25 % IJ SOLN
4.0000 mL | INTRAMUSCULAR | Status: AC | PRN
  Administered 2019-11-05: 4 mL via INTRA_ARTICULAR

## 2019-11-06 MED ORDER — LIDOCAINE HCL 2 % IJ SOLN
4.0000 mL | INTRAMUSCULAR | Status: AC | PRN
  Administered 2019-11-05: 4 mL

## 2019-11-06 NOTE — Progress Notes (Signed)
Cassandra Mccullough - 69 y.o. female MRN [id number]  Date of birth: 01-Jan-1951  Office Visit Note: Visit Date: 11/05/2019 PCP: Neale Burly, MD Referred by: Neale Burly, MD  Subjective: Chief Complaint  Patient presents with  . Left Hip - Pain   HPI:  Cassandra Mccullough is a 69 y.o. female who comes in today For planned left greater trochanteric bursa injection with fluoroscopic guidance.  Patient was originally sent to [place] by Dr. Rondell Reams who follows her from a rheumatologic standpoint.  In the body habitus we did complete injection with fluoroscopic guidance back in January with excellent relief up until just a few weeks ago.  She can obviously have repeat injections in the order probably 3-4 and a year although there is no set quantity we just follow her clinically.  We will repeat this today.  Her pain is over the greater trochanter and she has concordant pain over the greater trochanter without any groin pain or focal weakness or new symptoms or trauma.  ROS Otherwise per HPI.  Assessment & Plan: Visit Diagnoses:  1. Pain in left hip     Plan: No additional findings.   Meds & Orders: No orders of the defined types were placed in this encounter.   Orders Placed This Encounter  Procedures  . Large Joint Inj  . XR C-ARM NO REPORT    Follow-up: Return if symptoms worsen or fail to improve.   Procedures: Large Joint Inj: L greater trochanter on 11/05/2019 9:45 AM Indications: pain and diagnostic evaluation Details: 22 G 3.5 in needle, fluoroscopy-guided lateral approach  Arthrogram: No  Medications: 4 mL lidocaine 2 %; 4 mL bupivacaine 0.25 %; 60 mg triamcinolone acetonide 40 MG/ML Outcome: tolerated well, no immediate complications  There was excellent flow of contrast outlined the greater trochanteric bursa without vascular uptake. Procedure, treatment alternatives, risks and benefits explained, specific risks discussed. Consent was given by the patient.  Immediately prior to procedure a time out was called to verify the correct patient, procedure, equipment, support staff and site/side marked as required. Patient was prepped and draped in the usual sterile fashion.      No notes on file   Clinical History: MR OF THE LEFT HIP WITHOUT CONTRAST  TECHNIQUE: Multiplanar, multisequence MR imaging was performed. No intravenous contrast was administered.  COMPARISON:  Radiographs dated 05/12/2019  FINDINGS: Bones: There is a prominent osteophyte on the anterior aspect of the left greater trochanter at the insertion of the left gluteus minimus tendon. The bones of the left hip and pelvis are otherwise normal.  Articular cartilage and labrum  Articular cartilage:  Normal.  Labrum:  Normal.  Joint or bursal effusion  Joint effusion:  No effusion.  Bursae: Slight edema in the left greater trochanteric bursa adjacent to the degenerative changes in the distal gluteus medius tendon.  Muscles and tendons  Muscles and tendons: Focal inflammation around the insertion of the left gluteus medius tendon on the lateral aspect of the left greater trochanter.  Other findings  Miscellaneous: No adenopathy. Slight diverticulosis of the sigmoid portion of the colon.  IMPRESSION: 1. Focal inflammation of the left greater trochanteric bursa adjacent to the insertion of the left gluteus medius tendon. 2. Slight inflammation of the left gluteus medius tendon at the insertion on the lateral aspect of the left greater trochanter. 3. Otherwise, normal exam.   Electronically Signed   By: Lorriane Shire M.D.   On: 06/22/2019 14:30  Objective:  VS:  HT:    WT:   BMI:     BP:   HR: bpm  TEMP: ( )  RESP:  Physical Exam   Imaging: XR C-ARM NO REPORT  Result Date: 11/05/2019 Please see Notes tab for imaging impression.

## 2020-02-09 DIAGNOSIS — J0191 Acute recurrent sinusitis, unspecified: Secondary | ICD-10-CM | POA: Diagnosis not present

## 2020-02-15 DIAGNOSIS — Z20822 Contact with and (suspected) exposure to covid-19: Secondary | ICD-10-CM | POA: Diagnosis not present

## 2020-02-15 DIAGNOSIS — R05 Cough: Secondary | ICD-10-CM | POA: Diagnosis not present

## 2020-02-23 DIAGNOSIS — Z85828 Personal history of other malignant neoplasm of skin: Secondary | ICD-10-CM | POA: Diagnosis not present

## 2020-02-23 DIAGNOSIS — L570 Actinic keratosis: Secondary | ICD-10-CM | POA: Diagnosis not present

## 2020-02-23 DIAGNOSIS — B009 Herpesviral infection, unspecified: Secondary | ICD-10-CM | POA: Diagnosis not present

## 2020-02-23 DIAGNOSIS — L814 Other melanin hyperpigmentation: Secondary | ICD-10-CM | POA: Diagnosis not present

## 2020-05-16 DIAGNOSIS — E1143 Type 2 diabetes mellitus with diabetic autonomic (poly)neuropathy: Secondary | ICD-10-CM | POA: Diagnosis not present

## 2020-05-16 DIAGNOSIS — M25551 Pain in right hip: Secondary | ICD-10-CM | POA: Diagnosis not present

## 2020-05-16 DIAGNOSIS — I10 Essential (primary) hypertension: Secondary | ICD-10-CM | POA: Diagnosis not present

## 2020-08-16 DIAGNOSIS — I471 Supraventricular tachycardia: Secondary | ICD-10-CM | POA: Diagnosis not present

## 2020-08-16 DIAGNOSIS — E1143 Type 2 diabetes mellitus with diabetic autonomic (poly)neuropathy: Secondary | ICD-10-CM | POA: Diagnosis not present

## 2020-08-16 DIAGNOSIS — M25551 Pain in right hip: Secondary | ICD-10-CM | POA: Diagnosis not present

## 2020-08-16 DIAGNOSIS — I10 Essential (primary) hypertension: Secondary | ICD-10-CM | POA: Diagnosis not present

## 2020-12-01 DIAGNOSIS — F064 Anxiety disorder due to known physiological condition: Secondary | ICD-10-CM | POA: Diagnosis not present

## 2020-12-01 DIAGNOSIS — U071 COVID-19: Secondary | ICD-10-CM | POA: Diagnosis not present

## 2020-12-15 DIAGNOSIS — Z23 Encounter for immunization: Secondary | ICD-10-CM | POA: Diagnosis not present

## 2021-03-13 DIAGNOSIS — R109 Unspecified abdominal pain: Secondary | ICD-10-CM | POA: Diagnosis not present

## 2021-03-13 DIAGNOSIS — N200 Calculus of kidney: Secondary | ICD-10-CM | POA: Diagnosis not present

## 2021-03-13 DIAGNOSIS — R300 Dysuria: Secondary | ICD-10-CM | POA: Diagnosis not present

## 2021-03-13 DIAGNOSIS — R310 Gross hematuria: Secondary | ICD-10-CM | POA: Diagnosis not present

## 2021-03-21 DIAGNOSIS — F064 Anxiety disorder due to known physiological condition: Secondary | ICD-10-CM | POA: Diagnosis not present

## 2021-03-21 DIAGNOSIS — F330 Major depressive disorder, recurrent, mild: Secondary | ICD-10-CM | POA: Diagnosis not present

## 2021-03-21 DIAGNOSIS — Z1331 Encounter for screening for depression: Secondary | ICD-10-CM | POA: Diagnosis not present

## 2021-03-21 DIAGNOSIS — Z0000 Encounter for general adult medical examination without abnormal findings: Secondary | ICD-10-CM | POA: Diagnosis not present

## 2021-03-21 DIAGNOSIS — I10 Essential (primary) hypertension: Secondary | ICD-10-CM | POA: Diagnosis not present

## 2021-03-30 ENCOUNTER — Encounter: Payer: Self-pay | Admitting: Adult Health

## 2021-04-03 DIAGNOSIS — G3184 Mild cognitive impairment, so stated: Secondary | ICD-10-CM | POA: Diagnosis not present

## 2021-04-20 ENCOUNTER — Ambulatory Visit (INDEPENDENT_AMBULATORY_CARE_PROVIDER_SITE_OTHER): Payer: Medicare Other | Admitting: Urology

## 2021-04-20 ENCOUNTER — Encounter: Payer: Self-pay | Admitting: Urology

## 2021-04-20 ENCOUNTER — Other Ambulatory Visit: Payer: Self-pay

## 2021-04-20 VITALS — BP 172/89 | HR 54

## 2021-04-20 DIAGNOSIS — R310 Gross hematuria: Secondary | ICD-10-CM

## 2021-04-20 DIAGNOSIS — R350 Frequency of micturition: Secondary | ICD-10-CM

## 2021-04-20 DIAGNOSIS — N200 Calculus of kidney: Secondary | ICD-10-CM

## 2021-04-20 DIAGNOSIS — R3129 Other microscopic hematuria: Secondary | ICD-10-CM

## 2021-04-20 LAB — URINALYSIS, ROUTINE W REFLEX MICROSCOPIC
Bilirubin, UA: NEGATIVE
Glucose, UA: NEGATIVE
Leukocytes,UA: NEGATIVE
Nitrite, UA: NEGATIVE
Specific Gravity, UA: >1.03 — ABNORMAL HIGH (ref 1.005–1.030)
Urobilinogen, Ur: 0.2 mg/dL (ref 0.2–1.0)
pH, UA: 6 (ref 5.0–7.5)

## 2021-04-20 LAB — MICROSCOPIC EXAMINATION
Epithelial Cells (non renal): >10 /hpf — AB (ref 0–10)
RBC, Urine: >30 /hpf — AB (ref 0–2)
Renal Epithel, UA: NONE SEEN /hpf

## 2021-04-20 LAB — BLADDER SCAN AMB NON-IMAGING: Scan Result: 16

## 2021-04-20 NOTE — Progress Notes (Signed)
Urological Symptom Review  Patient is experiencing the following symptoms: Get up at night to urinate Leakage of urine Blood in urine Weak stream   Review of Systems  Gastrointestinal (upper)  : Negative for upper GI symptoms  Gastrointestinal (lower) : Negative for lower GI symptoms  Constitutional : Night Sweats  Skin: Negative for skin symptoms  Eyes: Negative for eye symptoms  Ear/Nose/Throat : Negative for Ear/Nose/Throat symptoms  Hematologic/Lymphatic: Easy bruising  Cardiovascular : Negative for cardiovascular symptoms  Respiratory : Negative for respiratory symptoms  Endocrine: Negative for endocrine symptoms  Musculoskeletal: Back pain Joint pain  Neurological: Headaches  Psychologic: Depression Anxiety

## 2021-04-20 NOTE — Progress Notes (Signed)
Assessment: 1. Kidney stones   2. Gross hematuria   3. Microscopic hematuria   4. Urinary frequency      Plan: Today I had a discussion with the patient regarding the findings of  gross and microscopic  hematuria including the implications and differential diagnoses associated with it.  I also discussed recommendations for further evaluation including the rationale for upper tract imaging and cystoscopy.  I discussed the nature of these procedures including potential risk and complications.  The patient expressed an understanding of these issues. BMP today Schedule for CT hematuria protocol followed by cystoscopy   Chief Complaint:  Chief Complaint  Patient presents with   Nephrolithiasis   Hematuria     History of Present Illness:  Cassandra Mccullough is a 70 y.o. year old female who is seen in consultation from Neale Burly, MD  for evaluation of gross hematuria and nephrolithiasis.  She presented to an outside ER on 03/13/2021 with a 3-4-day history of gross hematuria.  She was not having any dysuria or flank pain.  She did report low back pain.  KUB at that time showed a possible 4 mm calcification at the upper pole of the right kidney and a 4 mm calcification at the renal pelvis region of the left kidney.  Urinalysis showed large blood.  Urine culture showed no growth.  The gross hematuria subsequently resolved after 4 to 5 days.  She has continued to have suprapubic pressure and urinary frequency.  She has not experienced any flank pain.  She does have a remote history of kidney stones.  No history of tobacco use.   Past Medical History:  Past Medical History:  Diagnosis Date   Blister of heel 05/2012   fracture blisters right heel   Calcaneus fracture, right 04/29/2012   Chronic back pain    Dental crowns present    Depression    Diabetes mellitus without complication ([place])    Dyslipidemia    Fibromyalgia    Fibromyalgia 10/04/2015   Heartburn    occasional - OTC  as needed   History of GI bleed    Hypertension    under control, has been on med. x 5-6 yrs.   OSA (obstructive sleep apnea) 10/04/2015   PONV (postoperative nausea and vomiting)    Wears partial dentures    upper and lower    Past Surgical History:  Past Surgical History:  Procedure Laterality Date   ABDOMINAL HYSTERECTOMY     partial   ANTERIOR CERVICAL DISCECTOMY  07/18/1998   C5-6, C6-7 with arthrodesis   APPENDECTOMY     BILATERAL OOPHORECTOMY     with bilat. salpingectomy   FOOT SURGERY     right   LEFT HEART CATH AND CORONARY ANGIOGRAPHY N/A 02/01/2017   Procedure: Left Heart Cath and Coronary Angiography;  Surgeon: Troy Sine, MD;  Location: [facility];  Service: Cardiovascular;  Laterality: N/A;   ORIF CALCANEOUS FRACTURE  05/14/2012   Procedure: OPEN REDUCTION INTERNAL FIXATION (ORIF) CALCANEOUS FRACTURE;  Surgeon: Jolyn Nap, MD;  Location: [facility];  Service: Orthopedics;  Laterality: Right;   POSTERIOR LUMBAR FUSION  10/04/2004   TRACHEOSTOMY     TYMPANOPLASTY      Allergies:  Allergies  Allergen Reactions   Aspirin Nausea And Vomiting   Nsaids Other (See Comments)    GI BLEED   Other Nausea And Vomiting    general anesthesia   Pantoprazole Sodium  Stomach cramps    Family History:  Family History  Problem Relation Age of Onset   Prostate cancer Father    Colon cancer Mother    Rheum arthritis Sister    Osteoporosis Sister    Breast cancer Sister     Social History:  Social History   Tobacco Use   Smoking status: Former    Packs/day: 1.00    Years: 30.00    Pack years: 30.00    Types: Cigarettes    Quit date: 07/02/2001    Years since quitting: 19.8   Smokeless tobacco: Never   Tobacco comments:    quit smoking > 10 yrs. ago  Vaping Use   Vaping Use: Never used  Substance Use Topics   Alcohol use: No    Alcohol/week: 0.0 standard drinks   Drug use: No    Review of symptoms:  Constitutional:   Negative for unexplained weight loss, night sweats, fever, chills ENT:  Negative for nose bleeds, sinus pain, painful swallowing CV:  Negative for chest pain, shortness of breath, exercise intolerance, palpitations, loss of consciousness Resp:  Negative for cough, wheezing, shortness of breath GI:  Negative for nausea, vomiting, diarrhea, bloody stools GU:  Positives noted in HPI; otherwise negative for  dysuria, urinary incontinence Neuro:  Negative for seizures, poor balance, limb weakness, slurred speech Psych:  Negative for lack of energy, depression, anxiety Endocrine:  Negative for polydipsia, polyuria, symptoms of hypoglycemia (dizziness, hunger, sweating) Hematologic:  Negative for anemia, purpura, petechia, prolonged or excessive bleeding, use of anticoagulants  Allergic:  Negative for difficulty breathing or choking as a result of exposure to anything; no shellfish allergy; no allergic response (rash/itch) to materials, foods  Physical exam: BP (!) 172/89   Pulse (!) 54  GENERAL APPEARANCE:  Well appearing, well developed, well nourished, NAD HEENT: Atraumatic, Normocephalic, oropharynx clear. NECK: Supple without lymphadenopathy or thyromegaly. LUNGS: Clear to auscultation bilaterally. HEART: Regular Rate and Rhythm without murmurs, gallops, or rubs. ABDOMEN: Soft, non-tender, No Masses. EXTREMITIES: Moves all extremities well.  Without clubbing, cyanosis, or edema. NEUROLOGIC:  Alert and oriented x 3, normal gait, CN II-XII grossly intact.  MENTAL STATUS:  Appropriate. BACK:  Non-tender to palpation.  No CVAT SKIN:  Warm, dry and intact.    Results: Results for orders placed or performed in visit on 04/20/21 (from the past 24 hour(s))  Microscopic Examination   Collection Time: 04/20/21  4:19 PM   Urine  Result Value Ref Range   WBC, UA 0-5 0 - 5 /hpf   RBC >30 (A) 0 - 2 /hpf   Epithelial Cells (non renal) >10 (A) 0 - 10 /hpf   Renal Epithel, UA None seen None seen  /hpf   Mucus, UA Present Not Estab.   Bacteria, UA Few None seen/Few  Urinalysis, Routine w reflex microscopic   Collection Time: 04/20/21  4:19 PM  Result Value Ref Range   Specific Gravity, UA >1.030 (H) 1.005 - 1.030   pH, UA 6.0 5.0 - 7.5   Color, UA Amber (A) Yellow   Appearance Ur Clear Clear   Leukocytes,UA Negative Negative   Protein,UA 2+ (A) Negative/Trace   Glucose, UA Negative Negative   Ketones, UA Trace (A) Negative   RBC, UA 3+ (A) Negative   Bilirubin, UA Negative Negative   Urobilinogen, Ur 0.2 0.2 - 1.0 mg/dL   Nitrite, UA Negative Negative   Microscopic Examination See below:   BLADDER SCAN AMB NON-IMAGING   Collection Time: 04/20/21  4:36 PM  Result Value Ref Range   Scan Result 16

## 2021-04-21 DIAGNOSIS — N200 Calculus of kidney: Secondary | ICD-10-CM | POA: Insufficient documentation

## 2021-04-21 DIAGNOSIS — R310 Gross hematuria: Secondary | ICD-10-CM | POA: Insufficient documentation

## 2021-04-21 DIAGNOSIS — R350 Frequency of micturition: Secondary | ICD-10-CM | POA: Insufficient documentation

## 2021-04-21 DIAGNOSIS — R3129 Other microscopic hematuria: Secondary | ICD-10-CM | POA: Insufficient documentation

## 2021-04-21 LAB — BASIC METABOLIC PANEL
BUN/Creatinine Ratio: 20 (ref 12–28)
BUN: 19 mg/dL (ref 8–27)
CO2: 25 mmol/L (ref 20–29)
Calcium: 9.7 mg/dL (ref 8.7–10.3)
Chloride: 100 mmol/L (ref 96–106)
Creatinine, Ser: 0.93 mg/dL (ref 0.57–1.00)
Glucose: 95 mg/dL (ref 70–99)
Potassium: 3.8 mmol/L (ref 3.5–5.2)
Sodium: 142 mmol/L (ref 134–144)
eGFR: 67 mL/min/{1.73_m2} (ref >59)

## 2021-04-24 ENCOUNTER — Ambulatory Visit (HOSPITAL_COMMUNITY)
Admission: RE | Admit: 2021-04-24 | Discharge: 2021-04-24 | Disposition: A | Discharge status: Home / Self Care | Payer: Medicare Other | Source: Ambulatory Visit | Attending: Urology | Admitting: Urology

## 2021-04-24 ENCOUNTER — Other Ambulatory Visit: Payer: Self-pay

## 2021-04-24 DIAGNOSIS — R310 Gross hematuria: Secondary | ICD-10-CM | POA: Insufficient documentation

## 2021-04-24 DIAGNOSIS — R3129 Other microscopic hematuria: Secondary | ICD-10-CM | POA: Diagnosis not present

## 2021-04-24 DIAGNOSIS — N2889 Other specified disorders of kidney and ureter: Secondary | ICD-10-CM | POA: Diagnosis not present

## 2021-04-24 DIAGNOSIS — N200 Calculus of kidney: Secondary | ICD-10-CM | POA: Insufficient documentation

## 2021-04-24 DIAGNOSIS — K828 Other specified diseases of gallbladder: Secondary | ICD-10-CM | POA: Diagnosis not present

## 2021-04-24 MED ORDER — IOHEXOL 300 MG/ML  SOLN: 100.0000 mL | Freq: Once | INTRAMUSCULAR | Status: DC | PRN

## 2021-04-26 NOTE — Progress Notes (Signed)
Assessment: 1. Gross hematuria   2. Kidney stones   3. Mesenteric lymphadenopathy     Plan: I personally reviewed the CT study from 04/26/2021 with findings as noted below.  No evidence of any significant urinary tract source for the gross hematuria.  She does have some small calcifications in the left kidney but I do not think these are contributing to the hematuria.  I discussed the findings of mesenteric lymphadenopathy with her today.  I recommended she follow-up with her PCP for this as further evaluation may be indicated. Urine cytology sent today. Cipro x1 following cystoscopy. Return to office in 6 weeks.  Chief Complaint: Chief Complaint  Patient presents with   Hematuria     HPI: Cassandra Mccullough is a 70 y.o. female who presents for continued evaluation of gross hematuria and nephrolithiasis.  She presented to an outside ER on 03/13/2021 with a 3-4-day history of gross hematuria.  She was not having any dysuria or flank pain.  She did report low back pain.  KUB at that time showed a possible 4 mm calcification at the upper pole of the right kidney and a 4 mm calcification at the renal pelvis region of the left kidney.  Urinalysis showed large blood.  Urine culture showed no growth.  The gross hematuria subsequently resolved after 4 to 5 days.  She continued to have suprapubic pressure and urinary frequency.  She has not experienced any flank pain.  She does have a remote history of kidney stones.  No history of tobacco use Urinalysis from 04/20/2021 showed >30 RBCs.  She was evaluated with a CT scan on 04/26/2021.  This showed punctate calcifications in the lower pole of the left kidney, no other renal or ureteral calculi seen, normal renal enhancement, tiny low-density renal lesions bilaterally consistent with cysts, no evidence of filling defect or obstruction to either kidney, multiple newly enlarged lymph nodes within the small bowel mesentery measuring 2.1 x 1.2 cm and 2.5  x 1.5 cm. She presents today for cystoscopy. No further gross hematuria.  No dysuria or flank pain.  Portions of the above documentation were copied from a prior visit for review purposes only.  Allergies: Allergies  Allergen Reactions   Aspirin Nausea And Vomiting   Nsaids Other (See Comments)    GI BLEED   Other Nausea And Vomiting    general anesthesia   Pantoprazole Sodium     Stomach cramps    PMH: Past Medical History:  Diagnosis Date   Blister of heel 05/2012   fracture blisters right heel   Calcaneus fracture, right 04/29/2012   Chronic back pain    Dental crowns present    Depression    Diabetes mellitus without complication ([place])    Dyslipidemia    Fibromyalgia    Fibromyalgia 10/04/2015   Heartburn    occasional - OTC as needed   History of GI bleed    Hypertension    under control, has been on med. x 5-6 yrs.   OSA (obstructive sleep apnea) 10/04/2015   PONV (postoperative nausea and vomiting)    Wears partial dentures    upper and lower    PSH: Past Surgical History:  Procedure Laterality Date   ABDOMINAL HYSTERECTOMY     partial   ANTERIOR CERVICAL DISCECTOMY  07/18/1998   C5-6, C6-7 with arthrodesis   APPENDECTOMY     BILATERAL OOPHORECTOMY     with bilat. salpingectomy   FOOT SURGERY     right  LEFT HEART CATH AND CORONARY ANGIOGRAPHY N/A 02/01/2017   Procedure: Left Heart Cath and Coronary Angiography;  Surgeon: Troy Sine, MD;  Location: [facility];  Service: Cardiovascular;  Laterality: N/A;   ORIF CALCANEOUS FRACTURE  05/14/2012   Procedure: OPEN REDUCTION INTERNAL FIXATION (ORIF) CALCANEOUS FRACTURE;  Surgeon: Jolyn Nap, MD;  Location: [place];  Service: Orthopedics;  Laterality: Right;   POSTERIOR LUMBAR FUSION  10/04/2004   TRACHEOSTOMY     TYMPANOPLASTY      SH: Social History   Tobacco Use   Smoking status: Former    Packs/day: 1.00    Years: 30.00    Pack years: 30.00    Types: Cigarettes     Quit date: 07/02/2001    Years since quitting: 19.8   Smokeless tobacco: Never   Tobacco comments:    quit smoking > 10 yrs. ago  Vaping Use   Vaping Use: Never used  Substance Use Topics   Alcohol use: No    Alcohol/week: 0.0 standard drinks   Drug use: No    ROS: Constitutional:  Negative for fever, chills, weight loss CV: Negative for chest pain, previous MI, hypertension Respiratory:  Negative for shortness of breath, wheezing, sleep apnea, frequent cough GI:  Negative for nausea, vomiting, bloody stool, GERD  PE: BP (!) 191/81   Pulse (!) 54   Temp 98.2 F (36.8 C)   Wt 182 lb (82.6 kg)   BMI 25.75 kg/m  GENERAL APPEARANCE:  Well appearing, well developed, well nourished, NAD HEENT:  Atraumatic, normocephalic, oropharynx clear NECK:  Supple without lymphadenopathy or thyromegaly ABDOMEN:  Soft, non-tender, no masses EXTREMITIES:  Moves all extremities well, without clubbing, cyanosis, or edema NEUROLOGIC:  Alert and oriented x 3, normal gait, CN II-XII grossly intact MENTAL STATUS:  appropriate BACK:  Non-tender to palpation, No CVAT SKIN:  Warm, dry, and intact   Results: U/A:  0-5 WBC, 0-2 RBC, mod bacteria  CT ABDOMEN AND PELVIS WITHOUT AND WITH CONTRAST   TECHNIQUE: Multidetector CT imaging of the abdomen and pelvis was performed following the standard protocol before and following the bolus administration of intravenous contrast.   CONTRAST:  100 cc Omnipaque 300   COMPARISON:  Noncontrast abdominopelvic CT 04/13/2008   FINDINGS: Lower chest: Stable mild scarring at both lung bases and a subpleural bulla in the right lower lobe. No significant pleural or pericardial effusion.   Hepatobiliary: The liver is normal in density without suspicious focal abnormality. The gallbladder is incompletely distended. No evidence of gallstones, gallbladder wall thickening or biliary dilatation.   Pancreas: Unremarkable. No pancreatic ductal dilatation  or surrounding inflammatory changes.   Spleen: Normal in size without focal abnormality.   Adrenals/Urinary Tract: Both adrenal glands appear normal. Pre contrast images demonstrate punctate calcifications within the lower pole calyces of the left kidney (image 41/2). No other renal, ureteral or bladder calculi are identified. Post contrast, both kidneys enhance normally. There are tiny low-density renal lesions bilaterally, too small to characterize, but likely cysts. Delayed post-contrast images demonstrate segmental visualization of the ureters. There is no definite abnormal enhancement or filling defect associated with the small calcifications in the lower pole calyx of the left kidney. The ureters and bladder appear unremarkable.   Stomach/Bowel: No enteric contrast was administered. The stomach appears unremarkable for its degree of distension. No evidence of bowel wall thickening, distention or surrounding inflammatory change. The appendix is not visualized.   Vascular/Lymphatic: There are multiple newly enlarged lymph  nodes within the small bowel mesentery, largest measuring 2.1 x 1.2 cm on image 53/7 and 2.5 x 1.5 cm on image 59/7. There is associated soft tissue stranding in the surrounding mesenteric fat (misty mesentery). No enlarged retroperitoneal, pelvic or inguinal lymph nodes. Aortic and branch vessel atherosclerosis without evidence of aneurysm or large vessel occlusion. The portal, superior mesenteric and splenic veins are patent.   Reproductive: Hysterectomy.  No adnexal mass.   Other: No evidence of abdominal wall mass or hernia. No ascites.   Musculoskeletal: No acute or significant osseous findings. Status post interbody and inter spinous fusion at L5-S1.   IMPRESSION: 1. Tiny calcifications in the lower pole calyx of the left kidney, likely small nonobstructing calculi. No evidence of ureteral calculus or hydronephrosis. 2. No definite urothelial  lesion or suspicious renal mass. If the patient has persistent unexplained hematuria, further evaluation of the bladder may be warranted. 3. Multiple new mildly enlarged mesenteric lymph nodes with associated surrounding mesenteric soft tissue stranding. These findings are nonspecific and could reflect mesenteric inflammation, lymphoproliferative disorder or metastatic disease. No primary malignancy or bowel obstruction identified. If clinical history or laboratory analysis suggests underlying malignancy, tissue sampling recommended. Otherwise, recommend 3 month follow-up CT. This recommendation follows ACR consensus guidelines: White Paper of the ACR Incidental Findings Committee II on Splenic and Nodal Findings. J Am Coll Radiol 2013;10:789-794. 4.  Aortic Atherosclerosis (ICD10-I70.0). 5. These results will be called to the ordering clinician or representative by the Radiologist Assistant, and communication documented in the PACS or [employer].     Electronically Signed   By: Richardean Sale M.D.   On: 04/26/2021 10:34  Procedure:  Flexible Cystourethroscopy  Pre-operative Diagnosis: Gross hematuria  Post-operative Diagnosis: Gross hematuria  Anesthesia:  local with lidocaine jelly  Surgical Narrative:  After appropriate informed consent was obtained, the patient was prepped and draped in the usual sterile fashion in the supine position.  He was correctly identified and the proper procedure delineated prior to proceeding.  Sterile lidocaine gel was instilled in the urethra. The flexible cystoscope was introduced without difficulty.  The bladder and urethra were examined completely.  Findings:  Anterior urethra: Normal  Bladder: Normal  Ureteral orifices: normal  Additional findings: none  Saline bladder wash for cytology was performed.    The cystoscope was then removed.  The patient tolerated the procedure well.

## 2021-04-27 ENCOUNTER — Other Ambulatory Visit: Payer: Self-pay

## 2021-04-27 ENCOUNTER — Encounter: Payer: Self-pay | Admitting: Urology

## 2021-04-27 ENCOUNTER — Ambulatory Visit (INDEPENDENT_AMBULATORY_CARE_PROVIDER_SITE_OTHER): Payer: Medicare Other | Admitting: Urology

## 2021-04-27 VITALS — BP 191/81 | HR 54 | Temp 98.2°F | Wt 182.0 lb

## 2021-04-27 DIAGNOSIS — R310 Gross hematuria: Secondary | ICD-10-CM

## 2021-04-27 DIAGNOSIS — N200 Calculus of kidney: Secondary | ICD-10-CM | POA: Diagnosis not present

## 2021-04-27 DIAGNOSIS — R590 Localized enlarged lymph nodes: Secondary | ICD-10-CM

## 2021-04-27 LAB — URINALYSIS, ROUTINE W REFLEX MICROSCOPIC
Bilirubin, UA: NEGATIVE
Glucose, UA: NEGATIVE
Ketones, UA: NEGATIVE
Leukocytes,UA: NEGATIVE
Nitrite, UA: NEGATIVE
Specific Gravity, UA: 1.025 (ref 1.005–1.030)
Urobilinogen, Ur: 0.2 mg/dL (ref 0.2–1.0)
pH, UA: 6.5 (ref 5.0–7.5)

## 2021-04-27 LAB — MICROSCOPIC EXAMINATION: Renal Epithel, UA: NONE SEEN /hpf

## 2021-04-27 MED ORDER — CIPROFLOXACIN HCL 500 MG PO TABS
500.0000 mg | ORAL_TABLET | Freq: Once | ORAL | Status: AC
  Administered 2021-04-27: 500 mg via ORAL

## 2021-04-27 NOTE — Addendum Note (Signed)
Addended by: Pollyann Kennedy F on: 04/27/2021 04:18 PM   Modules accepted: Orders

## 2021-04-27 NOTE — Addendum Note (Signed)
Addended by: Dorisann Frames on: 04/27/2021 04:17 PM   Modules accepted: Orders

## 2021-04-27 NOTE — Progress Notes (Signed)
Urological Symptom Review  Patient is experiencing the following symptoms: Get up at night to urinate Leakage of urine   Review of Systems  Gastrointestinal (upper)  : Indigestion/heartburn  Gastrointestinal (lower) : Negative for lower GI symptoms  Constitutional : Negative for symptoms  Skin: Negative for skin symptoms  Eyes: Negative for eye symptoms  Ear/Nose/Throat : Negative for Ear/Nose/Throat symptoms  Hematologic/Lymphatic: Negative for Hematologic/Lymphatic symptoms  Cardiovascular : Negative for cardiovascular symptoms  Respiratory : Negative for respiratory symptoms  Endocrine: Negative for endocrine symptoms  Musculoskeletal: Back pain Joint pain  Neurological: Negative for neurological symptoms  Psychologic: Negative for psychiatric symptoms

## 2021-05-01 LAB — CYTOLOGY, URINE

## 2021-05-17 ENCOUNTER — Encounter: Payer: Medicare Other | Admitting: Adult Health

## 2021-05-23 ENCOUNTER — Other Ambulatory Visit (HOSPITAL_COMMUNITY): Payer: Medicare Other

## 2021-06-08 ENCOUNTER — Ambulatory Visit: Payer: Medicare Other | Admitting: Urology

## 2021-06-08 DIAGNOSIS — R310 Gross hematuria: Secondary | ICD-10-CM

## 2021-06-08 DIAGNOSIS — R350 Frequency of micturition: Secondary | ICD-10-CM

## 2021-06-08 NOTE — Progress Notes (Deleted)
Assessment: 1. Gross hematuria   2. Urinary frequency     Plan: Return to office in 6 weeks.  Chief Complaint: No chief complaint on file.    HPI: Cassandra Mccullough is a 70 y.o. female who presents for continued evaluation of gross hematuria and nephrolithiasis.  She presented to an outside ER on 03/13/2021 with a 3-4-day history of gross hematuria.  She was not having any dysuria or flank pain.  She did report low back pain.  KUB at that time showed a possible 4 mm calcification at the upper pole of the right kidney and a 4 mm calcification at the renal pelvis region of the left kidney.  Urinalysis showed large blood.  Urine culture showed no growth.  The gross hematuria subsequently resolved after 4 to 5 days.  She continued to have suprapubic pressure and urinary frequency.  She has not experienced any flank pain.  She does have a remote history of kidney stones.  No history of tobacco use Urinalysis from 04/20/2021 showed >30 RBCs.  She was evaluated with a CT scan on 04/26/2021.  This showed punctate calcifications in the lower pole of the left kidney, no other renal or ureteral calculi seen, normal renal enhancement, tiny low-density renal lesions bilaterally consistent with cysts, no evidence of filling defect or obstruction to either kidney, multiple newly enlarged lymph nodes within the small bowel mesentery measuring 2.1 x 1.2 cm and 2.5 x 1.5 cm. Cystoscopy from 04/27/2021 showed no urethral or bladder abnormalities. Urine cytology was inconclusive with bland appearing urothelial cells in sheets and clusters, differential including instrumentation, lithiasis, and low-grade neoplasm.    Portions of the above documentation were copied from a prior visit for review purposes only.  Allergies: Allergies  Allergen Reactions   Aspirin Nausea And Vomiting   Nsaids Other (See Comments)    GI BLEED   Other Nausea And Vomiting    general anesthesia   Pantoprazole Sodium      Stomach cramps    PMH: Past Medical History:  Diagnosis Date   Blister of heel 05/2012   fracture blisters right heel   Calcaneus fracture, right 04/29/2012   Chronic back pain    Dental crowns present    Depression    Diabetes mellitus without complication ([place])    Dyslipidemia    Fibromyalgia    Fibromyalgia 10/04/2015   Heartburn    occasional - OTC as needed   History of GI bleed    Hypertension    under control, has been on med. x 5-6 yrs.   OSA (obstructive sleep apnea) 10/04/2015   PONV (postoperative nausea and vomiting)    Wears partial dentures    upper and lower    PSH: Past Surgical History:  Procedure Laterality Date   ABDOMINAL HYSTERECTOMY     partial   ANTERIOR CERVICAL DISCECTOMY  07/18/1998   C5-6, C6-7 with arthrodesis   APPENDECTOMY     BILATERAL OOPHORECTOMY     with bilat. salpingectomy   FOOT SURGERY     right   LEFT HEART CATH AND CORONARY ANGIOGRAPHY N/A 02/01/2017   Procedure: Left Heart Cath and Coronary Angiography;  Surgeon: Troy Sine, MD;  Location: [facility];  Service: Cardiovascular;  Laterality: N/A;   ORIF CALCANEOUS FRACTURE  05/14/2012   Procedure: OPEN REDUCTION INTERNAL FIXATION (ORIF) CALCANEOUS FRACTURE;  Surgeon: Jolyn Nap, MD;  Location: [facility];  Service: Orthopedics;  Laterality: Right;   POSTERIOR LUMBAR FUSION  10/04/2004   TRACHEOSTOMY     TYMPANOPLASTY      SH: Social History   Tobacco Use   Smoking status: Former    Packs/day: 1.00    Years: 30.00    Pack years: 30.00    Types: Cigarettes    Quit date: 07/02/2001    Years since quitting: 19.9   Smokeless tobacco: Never   Tobacco comments:    quit smoking > 10 yrs. ago  Vaping Use   Vaping Use: Never used  Substance Use Topics   Alcohol use: No    Alcohol/week: 0.0 standard drinks   Drug use: No    ROS: Constitutional:  Negative for fever, chills, weight loss CV: Negative for chest pain, previous MI,  hypertension Respiratory:  Negative for shortness of breath, wheezing, sleep apnea, frequent cough GI:  Negative for nausea, vomiting, bloody stool, GERD  PE: There were no vitals taken for this visit. ***   Results: ***

## 2021-06-16 DIAGNOSIS — H31003 Unspecified chorioretinal scars, bilateral: Secondary | ICD-10-CM | POA: Diagnosis not present

## 2021-06-16 DIAGNOSIS — E119 Type 2 diabetes mellitus without complications: Secondary | ICD-10-CM | POA: Diagnosis not present

## 2021-06-16 DIAGNOSIS — H35373 Puckering of macula, bilateral: Secondary | ICD-10-CM | POA: Diagnosis not present

## 2021-06-16 DIAGNOSIS — H47393 Other disorders of optic disc, bilateral: Secondary | ICD-10-CM | POA: Diagnosis not present

## 2021-06-22 DIAGNOSIS — H524 Presbyopia: Secondary | ICD-10-CM | POA: Diagnosis not present

## 2021-06-27 DIAGNOSIS — I10 Essential (primary) hypertension: Secondary | ICD-10-CM | POA: Diagnosis not present

## 2021-06-27 DIAGNOSIS — G3184 Mild cognitive impairment, so stated: Secondary | ICD-10-CM | POA: Diagnosis not present

## 2021-06-27 DIAGNOSIS — E1143 Type 2 diabetes mellitus with diabetic autonomic (poly)neuropathy: Secondary | ICD-10-CM | POA: Diagnosis not present

## 2021-08-10 ENCOUNTER — Encounter (HOSPITAL_COMMUNITY): Payer: Self-pay | Admitting: Radiology

## 2021-08-29 ENCOUNTER — Encounter: Payer: Self-pay | Admitting: Internal Medicine

## 2021-09-25 DIAGNOSIS — M810 Age-related osteoporosis without current pathological fracture: Secondary | ICD-10-CM | POA: Diagnosis not present

## 2021-09-25 DIAGNOSIS — M8589 Other specified disorders of bone density and structure, multiple sites: Secondary | ICD-10-CM | POA: Diagnosis not present

## 2021-10-04 DIAGNOSIS — E1143 Type 2 diabetes mellitus with diabetic autonomic (poly)neuropathy: Secondary | ICD-10-CM | POA: Diagnosis not present

## 2021-10-04 DIAGNOSIS — G3184 Mild cognitive impairment, so stated: Secondary | ICD-10-CM | POA: Diagnosis not present

## 2021-10-04 DIAGNOSIS — I10 Essential (primary) hypertension: Secondary | ICD-10-CM | POA: Diagnosis not present

## 2021-11-01 DIAGNOSIS — I471 Supraventricular tachycardia: Secondary | ICD-10-CM | POA: Diagnosis not present

## 2021-12-11 ENCOUNTER — Encounter: Payer: Self-pay | Admitting: *Deleted

## 2021-12-12 ENCOUNTER — Ambulatory Visit (INDEPENDENT_AMBULATORY_CARE_PROVIDER_SITE_OTHER): Payer: Medicare Other

## 2021-12-12 ENCOUNTER — Telehealth: Payer: Self-pay | Admitting: Cardiology

## 2021-12-12 ENCOUNTER — Ambulatory Visit (INDEPENDENT_AMBULATORY_CARE_PROVIDER_SITE_OTHER): Payer: Medicare Other | Admitting: Cardiology

## 2021-12-12 ENCOUNTER — Encounter: Payer: Self-pay | Admitting: Cardiology

## 2021-12-12 ENCOUNTER — Other Ambulatory Visit: Payer: Self-pay | Admitting: Cardiology

## 2021-12-12 VITALS — BP 122/70 | HR 44 | Ht 71.0 in | Wt 178.4 lb

## 2021-12-12 DIAGNOSIS — R002 Palpitations: Secondary | ICD-10-CM

## 2021-12-12 DIAGNOSIS — R001 Bradycardia, unspecified: Secondary | ICD-10-CM

## 2021-12-12 MED ORDER — METOPROLOL SUCCINATE ER 25 MG PO TB24: 25.0000 mg | ORAL_TABLET | Freq: Every day | ORAL | 1 refills | Status: DC

## 2021-12-12 NOTE — Telephone Encounter (Signed)
PERCERT:  14 day ZIO, placed and enrolled 12/12/21-[place]

## 2021-12-12 NOTE — Progress Notes (Signed)
Clinical Summary Cassandra Mccullough is a 71 y.o.female seen as a new patient, last seen in our office 03/2017   1. Palpitations  no recent palpitations since last visit.   - episodes of heart racing - several year history, worsening recently - occurs 1-2 times per week. Can occur at rest or with activity. Feeling of heart racing, pounding, skipping. Lasts a few minutes. Associated with SOB - coffee x 4-5 cups, decaf tea, occasional sodas, no energy drinks, no EtoH    2. Bradycardia - few times dizziness. Can occur in any position.  - +fatigue - started toprol about 1 year ago.    Medical issue from prior visit not addressed today.   3. Chest pain - long history of chest pain symptoms - from Dr Ron Parker old clinic notes seen in 2011 for chest pain, negative workup by echo and pharmacological stress.  - reports recent chest pain episodes starting yesterday. Sharp pain midchest 5/10 in severity with associated SOB, +diaphoresis. Has had some intermittent palpitations on and off since that time.    - 11/2016 ER visit to [facility] with chest pain. Negative workup for ACS, thought to be GI related.  - CXR did show aortic atherosclerosis.    12/2015 nuclear stress: no ischemia 12/2016 echo: LVEF 60-65%, no WMAs - 01/2017 cath: LM 35%, otherwise normal coronaries   - symptoms have improved since last visit, though still can occur with exertion.    Past Medical History:  Diagnosis Date   Blister of heel 05/2012   fracture blisters right heel   Calcaneus fracture, right 04/29/2012   Chronic back pain    Dental crowns present    Depression    Diabetes mellitus without complication ([place])    Dyslipidemia    Fibromyalgia    Fibromyalgia 10/04/2015   Heartburn    occasional - OTC as needed   History of GI bleed    Hypertension    under control, has been on med. x 5-6 yrs.   OSA (obstructive sleep apnea) 10/04/2015   PONV (postoperative nausea and vomiting)    Wears partial dentures     upper and lower     Allergies  Allergen Reactions   Aspirin Nausea And Vomiting   Nsaids Other (See Comments)    GI BLEED   Other Nausea And Vomiting    general anesthesia   Pantoprazole Sodium     Stomach cramps     Current Outpatient Medications  Medication Sig Dispense Refill   acetaminophen (TYLENOL) 500 MG tablet Take 1,000 mg by mouth daily as needed for moderate pain or headache.     amLODipine (NORVASC) 10 MG tablet Take 10 mg by mouth daily.      aspirin 81 MG tablet Take 81 mg by mouth every morning.      Biotin 2500 MCG CAPS Take 2,500 mcg by mouth daily.     Cholecalciferol (VITAMIN D PO) Take 1 capsule by mouth 2 (two) times daily.     FLUoxetine (PROZAC) 20 MG capsule Take 60 mg by mouth at bedtime.      hydrochlorothiazide (HYDRODIURIL) 25 MG tablet Take 25 mg by mouth daily.     isosorbide mononitrate (IMDUR) 30 MG 24 hr tablet Take 0.5 tablets (15 mg total) by mouth daily. 45 tablet 1   Liniments (SALONPAS PAIN RELIEF PATCH EX) Apply 1-2 patches topically daily as needed (pain).     losartan-hydrochlorothiazide (HYZAAR) 100-25 MG per tablet Take 1 tablet by mouth  every evening.      OVER THE COUNTER MEDICATION Apply 1 application topically 3 (three) times daily as needed (pain). Oxyrub otc pain cream     No current facility-administered medications for this visit.     Past Surgical History:  Procedure Laterality Date   ABDOMINAL HYSTERECTOMY     partial   ANTERIOR CERVICAL DISCECTOMY  07/18/1998   C5-6, C6-7 with arthrodesis   APPENDECTOMY     BILATERAL OOPHORECTOMY     with bilat. salpingectomy   FOOT SURGERY     right   LEFT HEART CATH AND CORONARY ANGIOGRAPHY N/A 02/01/2017   Procedure: Left Heart Cath and Coronary Angiography;  Surgeon: Troy Sine, MD;  Location: [facility];  Service: Cardiovascular;  Laterality: N/A;   ORIF CALCANEOUS FRACTURE  05/14/2012   Procedure: OPEN REDUCTION INTERNAL FIXATION (ORIF) CALCANEOUS FRACTURE;  Surgeon:  Jolyn Nap, MD;  Location: [facility];  Service: Orthopedics;  Laterality: Right;   POSTERIOR LUMBAR FUSION  10/04/2004   TRACHEOSTOMY     TYMPANOPLASTY       Allergies  Allergen Reactions   Aspirin Nausea And Vomiting   Nsaids Other (See Comments)    GI BLEED   Other Nausea And Vomiting    general anesthesia   Pantoprazole Sodium     Stomach cramps      Family History  Problem Relation Age of Onset   Prostate cancer Father    Colon cancer Mother    Rheum arthritis Sister    Osteoporosis Sister    Breast cancer Sister      Social History Cassandra Mccullough reports that she quit smoking about 20 years ago. Her smoking use included cigarettes. She has a 30.00 pack-year smoking history. She has never used smokeless tobacco. Cassandra Mccullough reports no history of alcohol use.   Review of Systems CONSTITUTIONAL: No weight loss, fever, chills, weakness or fatigue.  HEENT: Eyes: No visual loss, blurred vision, double vision or yellow sclerae.No hearing loss, sneezing, congestion, runny nose or sore throat.  SKIN: No rash or itching.  CARDIOVASCULAR: per hpi  RESPIRATORY: No shortness of breath, cough or sputum.  GASTROINTESTINAL: No anorexia, nausea, vomiting or diarrhea. No abdominal pain or blood.  GENITOURINARY: No burning on urination, no polyuria NEUROLOGICAL: No headache, dizziness, syncope, paralysis, ataxia, numbness or tingling in the extremities. No change in bowel or bladder control.  MUSCULOSKELETAL: No muscle, back pain, joint pain or stiffness.  LYMPHATICS: No enlarged nodes. No history of splenectomy.  PSYCHIATRIC: No history of depression or anxiety.  ENDOCRINOLOGIC: No reports of sweating, cold or heat intolerance. No polyuria or polydipsia.  Marland Kitchen   Physical Examination Today's Vitals   12/12/21 0914  BP: 122/70  Pulse: (!) 44  SpO2: 98%  Weight: 178 lb 6.4 oz (80.9 kg)  Height: '5\' 11"'$  (1.803 m)   Body mass index is 24.88 kg/m.  Gen:  resting comfortably, no acute distress HEENT: no scleral icterus, pupils equal round and reactive, no palptable cervical adenopathy,  CV: regular, brady, no m/r/g Resp: Clear to auscultation bilaterally GI: abdomen is soft, non-tender, non-distended, normal bowel sounds, no hepatosplenomegaly MSK: extremities are warm, no edema.  Skin: warm, no rash Neuro:  no focal deficits Psych: appropriate affect   Diagnostic Studies  12/2016 echo Study Conclusions   - Left ventricle: The cavity size was normal. Wall thickness was   normal. Systolic function was normal. The estimated ejection   fraction was in the range of 60%  to 65%. Wall motion was normal;   there were no regional wall motion abnormalities. - Aortic valve: Valve area (Vmax): 2.49 cm^2. - Tricuspid valve: There was mild-moderate regurgitation. - Pulmonary arteries: Systolic pressure was moderately increased.   PA peak pressure: 44 mm Hg (S).     01/2017 cath Ost LM lesion, 35 %stenosed. The left ventricular ejection fraction is greater than 65% by visual estimate. LV end diastolic pressure is normal. The left ventricular systolic function is normal.   Hyperdynamic LV function with an ejection fraction greater than 65%.   Smooth ostial narrowing of the left main coronary artery of 30-40% and otherwise normal LAD, diminutive left circumflex, and large dominant RCA.      Assessment and Plan  1. Palpitations - will plan for 14 day zio patch  2. Bradycardia - some dizziness at times, EKG today ectopy atrial bradycardia. Lower toprol to '25mg'$  daily, follow up monitor  Request labs from pcp     Arnoldo Lenis, M.D.

## 2021-12-12 NOTE — Patient Instructions (Signed)
Medication Instructions:  Your physician has recommended you make the following change in your medication:  Decrease metoprolol to 25 mg once a day Continue all other medications as directed  Labwork: none  Testing/Procedures: ZIO- Long Term Monitor Instructions   Your physician has requested you wear your ZIO patch monitor 14 days.   This is a single patch monitor.  [employer] supplies one patch monitor per enrollment.  Additional stickers are not available.   Please do not apply patch if you will be having a Nuclear Stress Test, Echocardiogram, Cardiac CT, MRI, or Chest Xray during the time frame you would be wearing the monitor. The patch cannot be worn during these tests.  You cannot remove and re-apply the ZIO XT patch monitor.   Your ZIO patch monitor will be sent USPS Priority mail from [facility] directly to your home address. The monitor may also be mailed to a PO BOX if home delivery is not available.   It may take 3-5 days to receive your monitor after you have been enrolled.   Once you have received you monitor, please review enclosed instructions.  Your monitor has already been registered assigning a specific monitor serial # to you.   Applying the monitor      Do not shower for the first 24 hours.  You may shower after the first 24 hours.   Press button if you feel a symptom. You will hear a small click.  Record Date, Time and Symptom in the Patient Log Book.   When you are ready to remove patch, follow instructions on last 2 pages of Patient Log Book.  Stick patch monitor onto last page of Patient Log Book.   Place Patient Log Book in [place] box.  Use locking tab on box and tape box closed securely.  The [employer] has [employer] on it.  Please place in mailbox as soon as possible.  Your physician should have your test results approximately 7 days after the monitor has been mailed back to [facility].   Call [facility] at  [phone] if you have questions regarding your ZIO XT patch monitor.  Call them immediately if you see an orange light blinking on your monitor.   If your monitor falls off in less than 4 days contact our Monitor department at [phone].  If your monitor becomes loose or falls off after 4 days call [employer] at [phone] for suggestions on securing your monitor.   Follow-Up: Your physician recommends that you schedule a follow-up appointment in: 3 months  Any Other Special Instructions Will Be Listed Below (If Applicable).  If you need a refill on your cardiac medications before your next appointment, please call your pharmacy.

## 2021-12-12 NOTE — Addendum Note (Signed)
Addended by: Merlene Laughter on: 12/12/2021 02:12 PM   Modules accepted: Orders

## 2022-01-04 DIAGNOSIS — R002 Palpitations: Secondary | ICD-10-CM | POA: Diagnosis not present

## 2022-01-10 DIAGNOSIS — G3184 Mild cognitive impairment, so stated: Secondary | ICD-10-CM | POA: Diagnosis not present

## 2022-01-10 DIAGNOSIS — I10 Essential (primary) hypertension: Secondary | ICD-10-CM | POA: Diagnosis not present

## 2022-01-10 DIAGNOSIS — E1143 Type 2 diabetes mellitus with diabetic autonomic (poly)neuropathy: Secondary | ICD-10-CM | POA: Diagnosis not present

## 2022-01-10 DIAGNOSIS — F330 Major depressive disorder, recurrent, mild: Secondary | ICD-10-CM | POA: Diagnosis not present

## 2022-01-10 DIAGNOSIS — I471 Supraventricular tachycardia: Secondary | ICD-10-CM | POA: Diagnosis not present

## 2022-01-24 DIAGNOSIS — S61213A Laceration without foreign body of left middle finger without damage to nail, initial encounter: Secondary | ICD-10-CM | POA: Diagnosis not present

## 2022-02-27 ENCOUNTER — Telehealth: Payer: Self-pay | Admitting: *Deleted

## 2022-02-27 NOTE — Telephone Encounter (Signed)
Laurine Blazer, LPN  5/00/1642 90:37 AM EDT Back to Top    Notified, copy to pcp.  States that her pcp d/c'd the [place] and states she is feeling a lot better.  HR back up in the 70's range.  Will re-evaluate at her next OV on 03/22/22 with Dr. Harl Bowie.  She will call if anything changes before then.

## 2022-02-27 NOTE — Telephone Encounter (Signed)
-----   Message from Merlene Laughter, RN sent at 02/22/2022  5:17 PM EDT -----  ----- Message ----- From: Arnoldo Lenis, MD Sent: 02/21/2022   3:42 PM EDT To: Merlene Laughter, RN  Monitor with some extra heart beats, can have a few in a row at times. Nothing dangerous but can cause some palpitatoins. How are her symptoms from palpitations standpoint doing  [name] MD

## 2022-03-06 DIAGNOSIS — L570 Actinic keratosis: Secondary | ICD-10-CM | POA: Diagnosis not present

## 2022-03-06 DIAGNOSIS — Z85828 Personal history of other malignant neoplasm of skin: Secondary | ICD-10-CM | POA: Diagnosis not present

## 2022-03-06 DIAGNOSIS — B009 Herpesviral infection, unspecified: Secondary | ICD-10-CM | POA: Diagnosis not present

## 2022-03-22 ENCOUNTER — Ambulatory Visit: Payer: Medicare Other | Admitting: Cardiology

## 2022-04-18 DIAGNOSIS — I10 Essential (primary) hypertension: Secondary | ICD-10-CM | POA: Diagnosis not present

## 2022-04-18 DIAGNOSIS — R0609 Other forms of dyspnea: Secondary | ICD-10-CM | POA: Diagnosis not present

## 2022-04-18 DIAGNOSIS — F330 Major depressive disorder, recurrent, mild: Secondary | ICD-10-CM | POA: Diagnosis not present

## 2022-04-18 DIAGNOSIS — Z1331 Encounter for screening for depression: Secondary | ICD-10-CM | POA: Diagnosis not present

## 2022-04-18 DIAGNOSIS — Z0000 Encounter for general adult medical examination without abnormal findings: Secondary | ICD-10-CM | POA: Diagnosis not present

## 2022-04-18 DIAGNOSIS — E1143 Type 2 diabetes mellitus with diabetic autonomic (poly)neuropathy: Secondary | ICD-10-CM | POA: Diagnosis not present

## 2022-04-18 DIAGNOSIS — G3184 Mild cognitive impairment, so stated: Secondary | ICD-10-CM | POA: Diagnosis not present

## 2022-04-18 DIAGNOSIS — R0602 Shortness of breath: Secondary | ICD-10-CM | POA: Diagnosis not present

## 2022-04-18 DIAGNOSIS — Z79899 Other long term (current) drug therapy: Secondary | ICD-10-CM | POA: Diagnosis not present

## 2022-04-24 DIAGNOSIS — Z1231 Encounter for screening mammogram for malignant neoplasm of breast: Secondary | ICD-10-CM | POA: Diagnosis not present

## 2022-07-19 DIAGNOSIS — E1143 Type 2 diabetes mellitus with diabetic autonomic (poly)neuropathy: Secondary | ICD-10-CM | POA: Diagnosis not present

## 2022-07-19 DIAGNOSIS — F330 Major depressive disorder, recurrent, mild: Secondary | ICD-10-CM | POA: Diagnosis not present

## 2022-07-19 DIAGNOSIS — I10 Essential (primary) hypertension: Secondary | ICD-10-CM | POA: Diagnosis not present

## 2022-07-19 DIAGNOSIS — N3000 Acute cystitis without hematuria: Secondary | ICD-10-CM | POA: Diagnosis not present

## 2022-09-06 ENCOUNTER — Ambulatory Visit (INDEPENDENT_AMBULATORY_CARE_PROVIDER_SITE_OTHER): Payer: Medicare Other

## 2022-09-06 ENCOUNTER — Ambulatory Visit (INDEPENDENT_AMBULATORY_CARE_PROVIDER_SITE_OTHER): Payer: Medicare Other | Admitting: Orthopaedic Surgery

## 2022-09-06 ENCOUNTER — Encounter: Payer: Self-pay | Admitting: Orthopaedic Surgery

## 2022-09-06 VITALS — Ht 72.0 in | Wt 180.0 lb

## 2022-09-06 DIAGNOSIS — M7741 Metatarsalgia, right foot: Secondary | ICD-10-CM

## 2022-09-06 DIAGNOSIS — G8929 Other chronic pain: Secondary | ICD-10-CM

## 2022-09-06 DIAGNOSIS — L84 Corns and callosities: Secondary | ICD-10-CM

## 2022-09-06 DIAGNOSIS — M25562 Pain in left knee: Secondary | ICD-10-CM

## 2022-09-06 DIAGNOSIS — M2242 Chondromalacia patellae, left knee: Secondary | ICD-10-CM | POA: Diagnosis not present

## 2022-09-07 DIAGNOSIS — M2242 Chondromalacia patellae, left knee: Secondary | ICD-10-CM | POA: Diagnosis not present

## 2022-09-07 DIAGNOSIS — M25562 Pain in left knee: Secondary | ICD-10-CM | POA: Insufficient documentation

## 2022-09-07 DIAGNOSIS — M7741 Metatarsalgia, right foot: Secondary | ICD-10-CM | POA: Insufficient documentation

## 2022-09-07 DIAGNOSIS — L84 Corns and callosities: Secondary | ICD-10-CM | POA: Insufficient documentation

## 2022-09-07 MED ORDER — METHYLPREDNISOLONE ACETATE 40 MG/ML IJ SUSP
40.0000 mg | INTRAMUSCULAR | Status: AC | PRN
  Administered 2022-09-07: 40 mg via INTRA_ARTICULAR

## 2022-09-07 MED ORDER — LIDOCAINE HCL 1 % IJ SOLN
0.5000 mL | INTRAMUSCULAR | Status: AC | PRN
  Administered 2022-09-07: .5 mL

## 2022-09-07 MED ORDER — BUPIVACAINE HCL 0.5 % IJ SOLN
3.0000 mL | INTRAMUSCULAR | Status: AC | PRN
  Administered 2022-09-07: 3 mL via INTRA_ARTICULAR

## 2022-09-07 NOTE — Progress Notes (Signed)
Office Visit Note   Patient: Cassandra Mccullough           Date of Birth: 01/15/1951           MRN: [id number] Visit Date: 09/06/2022              Requested by: Neale Burly, MD [place],  [place] [id number] PCP: Neale Burly, MD   Assessment & Plan: Visit Diagnoses:  1. Chronic pain of left knee   2. Callus of foot   3. Metatarsalgia, right foot     Plan: After informed consent calluses x 3 were trimmed off the right foot which are 1 cm thick over the plantar surface and 5 to 6 mm along the medial aspect of the great toe.  Trimmed down to soft and she had significant improvement in her foot pain with ambulation and we spent a long time discussing footcare and appropriate instruments and need to purchase to keep these buffed down.  Comfort inserts given which will give arch support to help unload the metatarsal problem with metatarsalgia that is present.  Left knee injection performed.  Will check her back again in a few weeks if she is having persistent problems we can proceed with MRI scan.  Follow-Up Instructions: No follow-ups on file.   Orders:  Orders Placed This Encounter  Procedures   XR KNEE 3 VIEW LEFT   No orders of the defined types were placed in this encounter.     Procedures: Large Joint Inj: L knee on 09/07/2022 8:04 AM Indications: joint swelling and pain Details: 22 G 1.5 in needle, anterolateral approach  Arthrogram: No  Medications: 0.5 mL lidocaine 1 %; 3 mL bupivacaine 0.5 %; 40 mg methylPREDNISolone acetate 40 MG/ML Outcome: tolerated well, no immediate complications Procedure, treatment alternatives, risks and benefits explained, specific risks discussed. Consent was given by the patient. Immediately prior to procedure a time out was called to verify the correct patient, procedure, equipment, support staff and site/side marked as required. Patient was prepped and draped in the usual sterile fashion.       Clinical Data: No  additional findings.   Subjective: Chief Complaint  Patient presents with   Left Knee - Pain    HPI 72 year old female referred by Dr.Hasanaj for problems with left knee pain sometimes giving way.  She states she is following with her knee swells intermittently.  She states she walks a lot but is concerned she might fall again.  She has significant calluses over the plantar surface of the right foot has bunions bilaterally on the right side claw toe deformity second and third toe with dorsal calluses.  Large plantar calluses painful makes her walk flat-footed short stride gait.  She states she had some arthroscopy in the distant past in the early 70s.  She cannot take anti-inflammatories since it tends to bother her stomach.  Review of Systems positive for hyperlipidemia hypertension history of kidney stones.  Negative for diabetes, MI or stroke.   Objective: Vital Signs: Ht 6' (1.829 m)   Wt 180 lb (81.6 kg)   BMI 24.41 kg/m   Physical Exam Constitutional:      Appearance: She is well-developed.  HENT:     Head: Normocephalic.     Right Ear: External ear normal.     Left Ear: External ear normal. There is no impacted cerumen.  Eyes:     Pupils: Pupils are equal, round, and reactive to light.  Neck:  Thyroid: No thyromegaly.     Trachea: No tracheal deviation.  Cardiovascular:     Rate and Rhythm: Normal rate.  Pulmonary:     Effort: Pulmonary effort is normal.  Abdominal:     Palpations: Abdomen is soft.  Musculoskeletal:     Cervical back: No rigidity.  Skin:    General: Skin is warm and dry.  Neurological:     Mental Status: She is alert and oriented to person, place, and time.  Psychiatric:        Behavior: Behavior normal.     Ortho Exam patient has some knee tenderness with flexion extension she has some synovial thickening adjacent to the patella laterally without patellar subluxation.  Mild joint line tenderness ACL PCL exam is normal.  2 large thickened  callus over plantar surface of the foot +1 along the medial aspect of the bunion which are all tender painful.  Plantar surfaces calluses are significantly tender particular over the third metatarsal head.  Specialty Comments:  No specialty comments available.  Imaging: No results found.   PMFS History: Patient Active Problem List   Diagnosis Date Noted   Callus of foot 09/07/2022   Pain in left knee 09/07/2022   Metatarsalgia, right foot 09/07/2022   Kidney stones 04/21/2021   Gross hematuria 04/21/2021   Microscopic hematuria 04/21/2021   Urinary frequency 04/21/2021   SHORTNESS OF BREATH 04/04/2010   CHEST PAIN 04/04/2010   DYSLIPIDEMIA 04/03/2010   HYPERTENSION 04/03/2010   BRADYCARDIA 04/03/2010   Past Medical History:  Diagnosis Date   Blister of heel 05/2012   fracture blisters right heel   Calcaneus fracture, right 04/29/2012   Chronic back pain    Dental crowns present    Depression    Diabetes mellitus without complication ([place])    Dyslipidemia    Fibromyalgia    Fibromyalgia 10/04/2015   Heartburn    occasional - OTC as needed   History of GI bleed    Hypertension    under control, has been on med. x 5-6 yrs.   OSA (obstructive sleep apnea) 10/04/2015   PONV (postoperative nausea and vomiting)    Wears partial dentures    upper and lower    Family History  Problem Relation Age of Onset   Prostate cancer Father    Colon cancer Mother    Rheum arthritis Sister    Osteoporosis Sister    Breast cancer Sister     Past Surgical History:  Procedure Laterality Date   ABDOMINAL HYSTERECTOMY     partial   ANTERIOR CERVICAL DISCECTOMY  07/18/1998   C5-6, C6-7 with arthrodesis   APPENDECTOMY     BILATERAL OOPHORECTOMY     with bilat. salpingectomy   FOOT SURGERY     right   LEFT HEART CATH AND CORONARY ANGIOGRAPHY N/A 02/01/2017   Procedure: Left Heart Cath and Coronary Angiography;  Surgeon: Troy Sine, MD;  Location: [place] [facility];  Service:  Cardiovascular;  Laterality: N/A;   ORIF CALCANEOUS FRACTURE  05/14/2012   Procedure: OPEN REDUCTION INTERNAL FIXATION (ORIF) CALCANEOUS FRACTURE;  Surgeon: Jolyn Nap, MD;  Location: [facility];  Service: Orthopedics;  Laterality: Right;   POSTERIOR LUMBAR FUSION  10/04/2004   TRACHEOSTOMY     TYMPANOPLASTY     Social History   Occupational History   Not on file  Tobacco Use   Smoking status: Former    Packs/day: 1.00    Years: 30.00    Total  pack years: 30.00    Types: Cigarettes    Quit date: 07/02/2001    Years since quitting: 21.1   Smokeless tobacco: Never   Tobacco comments:    quit smoking > 10 yrs. ago  Vaping Use   Vaping Use: Never used  Substance and Sexual Activity   Alcohol use: No    Alcohol/week: 0.0 standard drinks of alcohol   Drug use: No   Sexual activity: Not on file

## 2022-10-04 ENCOUNTER — Ambulatory Visit: Payer: Medicare Other | Admitting: Orthopaedic Surgery

## 2022-10-16 DIAGNOSIS — F330 Major depressive disorder, recurrent, mild: Secondary | ICD-10-CM | POA: Diagnosis not present

## 2022-10-16 DIAGNOSIS — I10 Essential (primary) hypertension: Secondary | ICD-10-CM | POA: Diagnosis not present

## 2022-10-16 DIAGNOSIS — N3000 Acute cystitis without hematuria: Secondary | ICD-10-CM | POA: Diagnosis not present

## 2022-10-16 DIAGNOSIS — B3784 Candidal otitis externa: Secondary | ICD-10-CM | POA: Diagnosis not present

## 2022-10-16 DIAGNOSIS — E1143 Type 2 diabetes mellitus with diabetic autonomic (poly)neuropathy: Secondary | ICD-10-CM | POA: Diagnosis not present

## 2022-12-10 DIAGNOSIS — S23130A Subluxation of T4/T5 thoracic vertebra, initial encounter: Secondary | ICD-10-CM | POA: Diagnosis not present

## 2022-12-10 DIAGNOSIS — M9902 Segmental and somatic dysfunction of thoracic region: Secondary | ICD-10-CM | POA: Diagnosis not present

## 2022-12-10 DIAGNOSIS — S13170A Subluxation of C6/C7 cervical vertebrae, initial encounter: Secondary | ICD-10-CM | POA: Diagnosis not present

## 2022-12-10 DIAGNOSIS — M9901 Segmental and somatic dysfunction of cervical region: Secondary | ICD-10-CM | POA: Diagnosis not present

## 2022-12-13 DIAGNOSIS — S13170A Subluxation of C6/C7 cervical vertebrae, initial encounter: Secondary | ICD-10-CM | POA: Diagnosis not present

## 2022-12-13 DIAGNOSIS — S23130A Subluxation of T4/T5 thoracic vertebra, initial encounter: Secondary | ICD-10-CM | POA: Diagnosis not present

## 2022-12-13 DIAGNOSIS — M9901 Segmental and somatic dysfunction of cervical region: Secondary | ICD-10-CM | POA: Diagnosis not present

## 2022-12-13 DIAGNOSIS — M9902 Segmental and somatic dysfunction of thoracic region: Secondary | ICD-10-CM | POA: Diagnosis not present

## 2022-12-17 DIAGNOSIS — S13170A Subluxation of C6/C7 cervical vertebrae, initial encounter: Secondary | ICD-10-CM | POA: Diagnosis not present

## 2022-12-17 DIAGNOSIS — M9901 Segmental and somatic dysfunction of cervical region: Secondary | ICD-10-CM | POA: Diagnosis not present

## 2022-12-17 DIAGNOSIS — S23130A Subluxation of T4/T5 thoracic vertebra, initial encounter: Secondary | ICD-10-CM | POA: Diagnosis not present

## 2022-12-17 DIAGNOSIS — M9902 Segmental and somatic dysfunction of thoracic region: Secondary | ICD-10-CM | POA: Diagnosis not present

## 2022-12-20 DIAGNOSIS — M9902 Segmental and somatic dysfunction of thoracic region: Secondary | ICD-10-CM | POA: Diagnosis not present

## 2022-12-20 DIAGNOSIS — S13170A Subluxation of C6/C7 cervical vertebrae, initial encounter: Secondary | ICD-10-CM | POA: Diagnosis not present

## 2022-12-20 DIAGNOSIS — M9901 Segmental and somatic dysfunction of cervical region: Secondary | ICD-10-CM | POA: Diagnosis not present

## 2022-12-20 DIAGNOSIS — S23130A Subluxation of T4/T5 thoracic vertebra, initial encounter: Secondary | ICD-10-CM | POA: Diagnosis not present

## 2022-12-25 DIAGNOSIS — S23130A Subluxation of T4/T5 thoracic vertebra, initial encounter: Secondary | ICD-10-CM | POA: Diagnosis not present

## 2022-12-25 DIAGNOSIS — S13170A Subluxation of C6/C7 cervical vertebrae, initial encounter: Secondary | ICD-10-CM | POA: Diagnosis not present

## 2022-12-25 DIAGNOSIS — M9901 Segmental and somatic dysfunction of cervical region: Secondary | ICD-10-CM | POA: Diagnosis not present

## 2022-12-25 DIAGNOSIS — M9902 Segmental and somatic dysfunction of thoracic region: Secondary | ICD-10-CM | POA: Diagnosis not present

## 2022-12-27 DIAGNOSIS — S23130A Subluxation of T4/T5 thoracic vertebra, initial encounter: Secondary | ICD-10-CM | POA: Diagnosis not present

## 2022-12-27 DIAGNOSIS — M9902 Segmental and somatic dysfunction of thoracic region: Secondary | ICD-10-CM | POA: Diagnosis not present

## 2022-12-27 DIAGNOSIS — S13170A Subluxation of C6/C7 cervical vertebrae, initial encounter: Secondary | ICD-10-CM | POA: Diagnosis not present

## 2022-12-27 DIAGNOSIS — M9901 Segmental and somatic dysfunction of cervical region: Secondary | ICD-10-CM | POA: Diagnosis not present

## 2023-01-08 DIAGNOSIS — M9901 Segmental and somatic dysfunction of cervical region: Secondary | ICD-10-CM | POA: Diagnosis not present

## 2023-01-08 DIAGNOSIS — S13170A Subluxation of C6/C7 cervical vertebrae, initial encounter: Secondary | ICD-10-CM | POA: Diagnosis not present

## 2023-01-08 DIAGNOSIS — M9902 Segmental and somatic dysfunction of thoracic region: Secondary | ICD-10-CM | POA: Diagnosis not present

## 2023-01-08 DIAGNOSIS — S23130A Subluxation of T4/T5 thoracic vertebra, initial encounter: Secondary | ICD-10-CM | POA: Diagnosis not present

## 2023-01-10 DIAGNOSIS — M25512 Pain in left shoulder: Secondary | ICD-10-CM | POA: Diagnosis not present

## 2023-01-10 DIAGNOSIS — M9902 Segmental and somatic dysfunction of thoracic region: Secondary | ICD-10-CM | POA: Diagnosis not present

## 2023-01-10 DIAGNOSIS — M9901 Segmental and somatic dysfunction of cervical region: Secondary | ICD-10-CM | POA: Diagnosis not present

## 2023-01-10 DIAGNOSIS — S13170A Subluxation of C6/C7 cervical vertebrae, initial encounter: Secondary | ICD-10-CM | POA: Diagnosis not present

## 2023-01-21 DIAGNOSIS — M797 Fibromyalgia: Secondary | ICD-10-CM | POA: Diagnosis not present

## 2023-01-21 DIAGNOSIS — F330 Major depressive disorder, recurrent, mild: Secondary | ICD-10-CM | POA: Diagnosis not present

## 2023-01-21 DIAGNOSIS — I10 Essential (primary) hypertension: Secondary | ICD-10-CM | POA: Diagnosis not present

## 2023-01-21 DIAGNOSIS — E1143 Type 2 diabetes mellitus with diabetic autonomic (poly)neuropathy: Secondary | ICD-10-CM | POA: Diagnosis not present

## 2023-03-06 DIAGNOSIS — L570 Actinic keratosis: Secondary | ICD-10-CM | POA: Diagnosis not present

## 2023-03-06 DIAGNOSIS — Z85828 Personal history of other malignant neoplasm of skin: Secondary | ICD-10-CM | POA: Diagnosis not present

## 2023-03-06 DIAGNOSIS — L663 Perifolliculitis capitis abscedens: Secondary | ICD-10-CM | POA: Diagnosis not present

## 2023-03-06 DIAGNOSIS — B009 Herpesviral infection, unspecified: Secondary | ICD-10-CM | POA: Diagnosis not present

## 2023-03-14 DIAGNOSIS — R42 Dizziness and giddiness: Secondary | ICD-10-CM | POA: Diagnosis not present

## 2023-03-14 DIAGNOSIS — H903 Sensorineural hearing loss, bilateral: Secondary | ICD-10-CM | POA: Diagnosis not present

## 2023-03-20 DIAGNOSIS — H8110 Benign paroxysmal vertigo, unspecified ear: Secondary | ICD-10-CM | POA: Diagnosis not present

## 2023-03-20 DIAGNOSIS — H903 Sensorineural hearing loss, bilateral: Secondary | ICD-10-CM | POA: Diagnosis not present

## 2023-03-20 IMAGING — CT CT ABD-PEL WO/W CM
3 of 12 series · 10 of 46 positions shown, 16 images · IV contrast (omnipaque)
Comparison: Noncontrast abdominopelvic CT 04/13/2008

CLINICAL DATA: Gross and microscopic hematuria for approximately 6
weeks. Possible kidney stone.

EXAM:
CT ABDOMEN AND PELVIS WITHOUT AND WITH CONTRAST
TECHNIQUE: Multidetector CT imaging of the abdomen and pelvis was performed
following the standard protocol before and following the bolus
administration of intravenous contrast.
CONTRAST:  100 cc Omnipaque 300

[Series 5: coronal pre · coronal · non-contrast · 0.78mm/px · 2 of 101 slices shown, 3 images]
[im 34/101  soft-tissue]
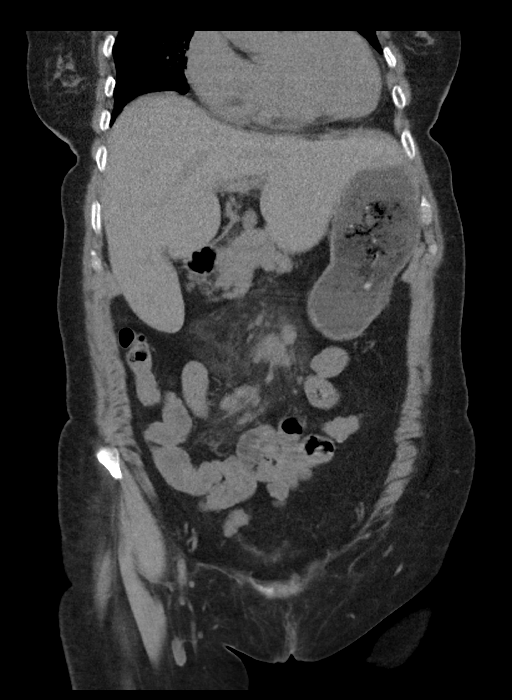
[im 34/101  bone]
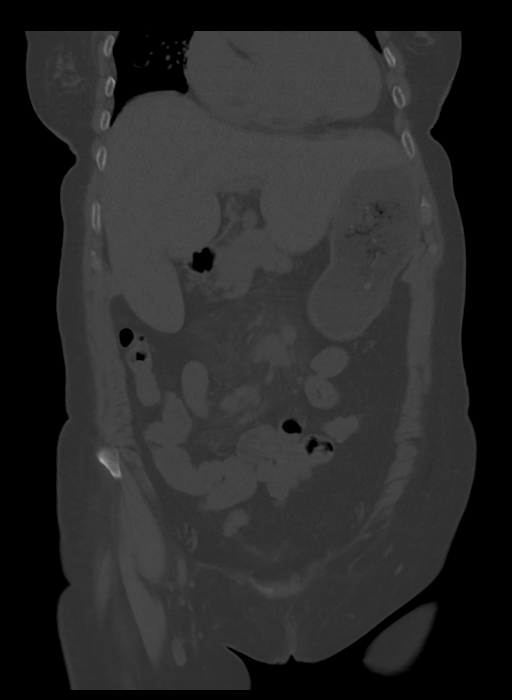
[im 67/101  soft-tissue]
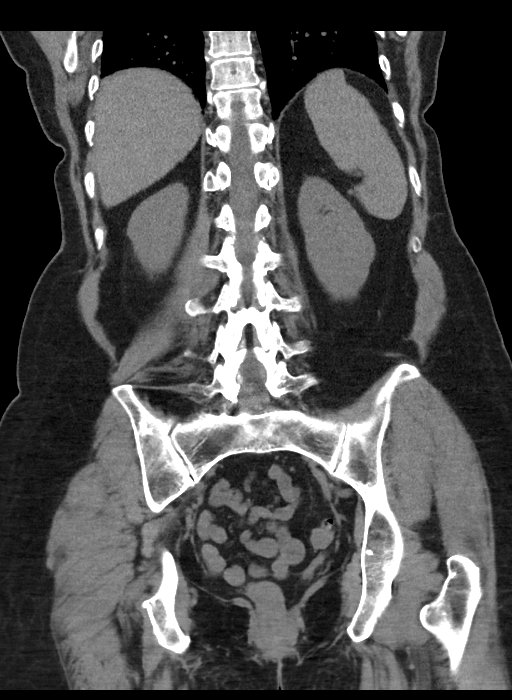

[Series 7: axial post · axial · 0.89mm/px · z∈[+452,+762]mm · 4 of 104 slices shown, 9 images]
[im 21/104  soft-tissue]
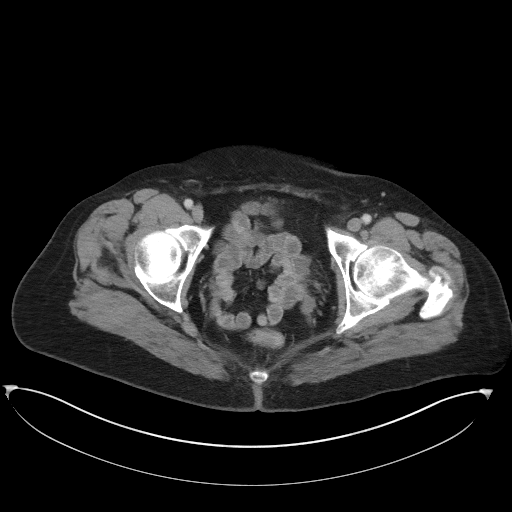
[im 21/104  lung]
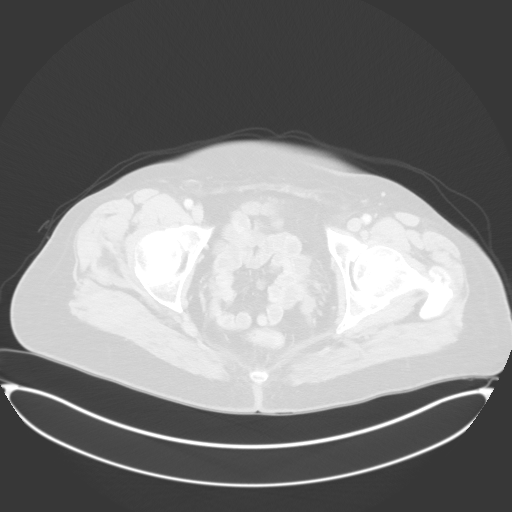
[im 21/104  bone]
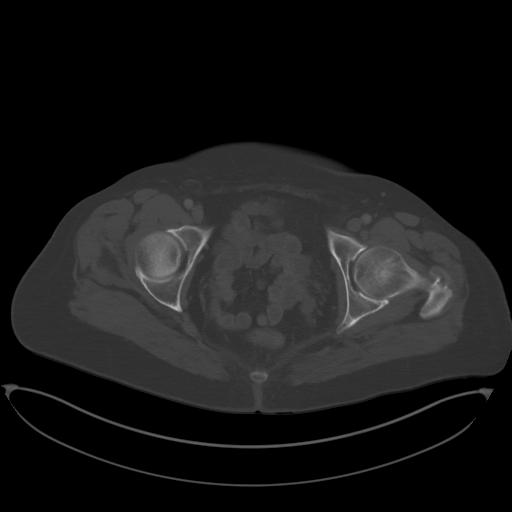
[im 42/104  soft-tissue]
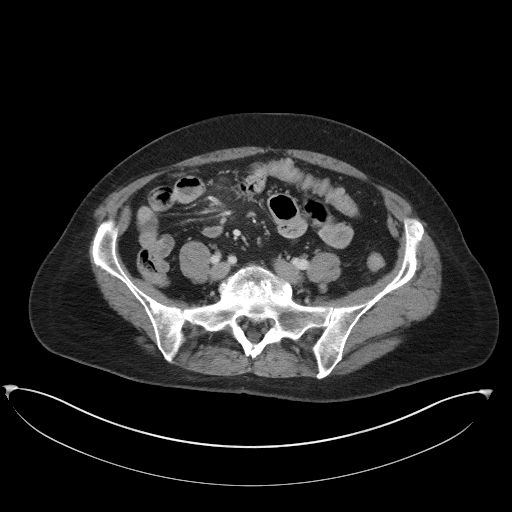
[im 42/104  lung]
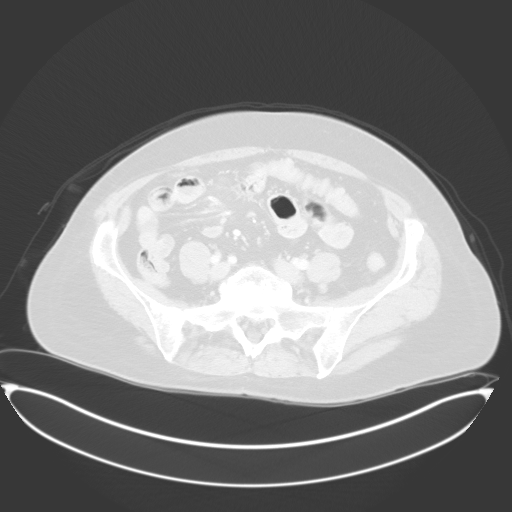
[im 62/104  soft-tissue]
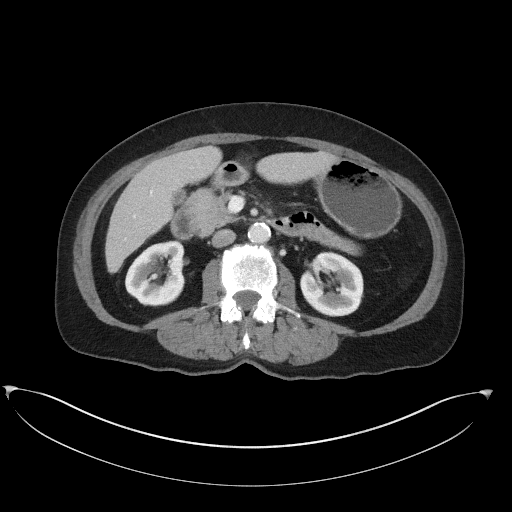
[im 62/104  lung]
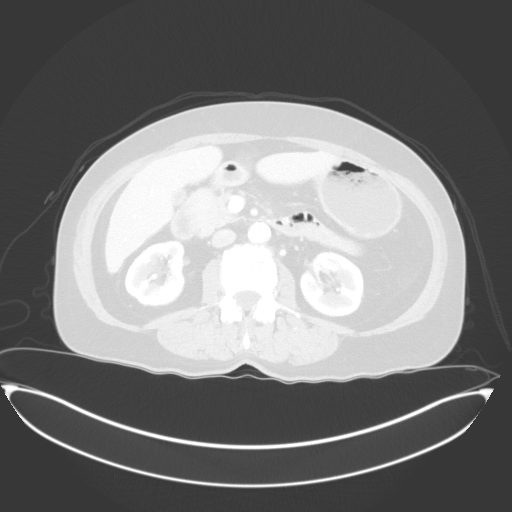
[im 83/104  soft-tissue]
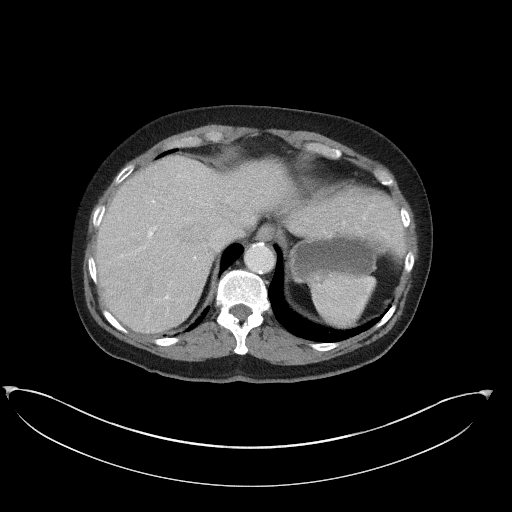
[im 83/104  lung]
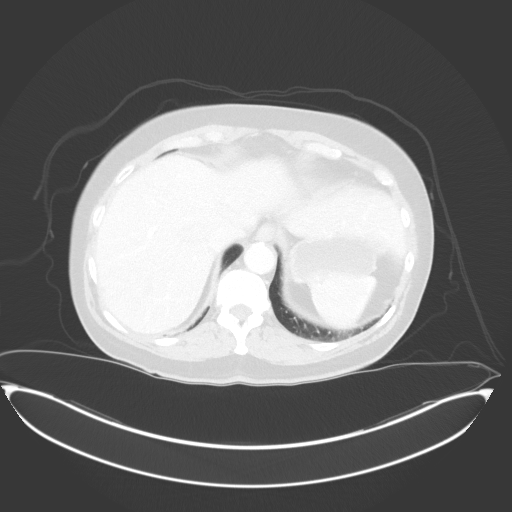

[Series 12: axial delay · axial · delayed · 0.89mm/px · z∈[+457,+767]mm · 4 of 104 slices shown]
[im 21/104  soft-tissue]
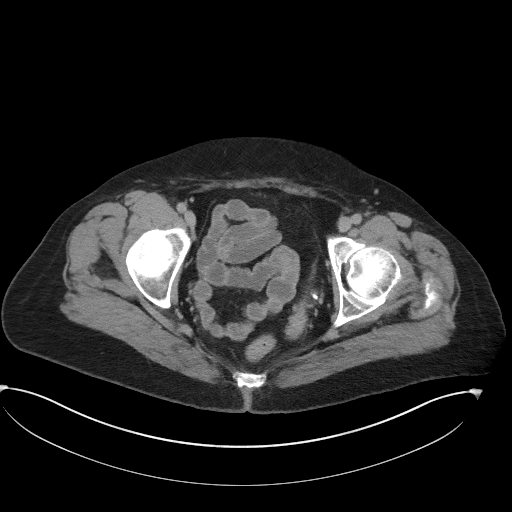
[im 42/104  soft-tissue]
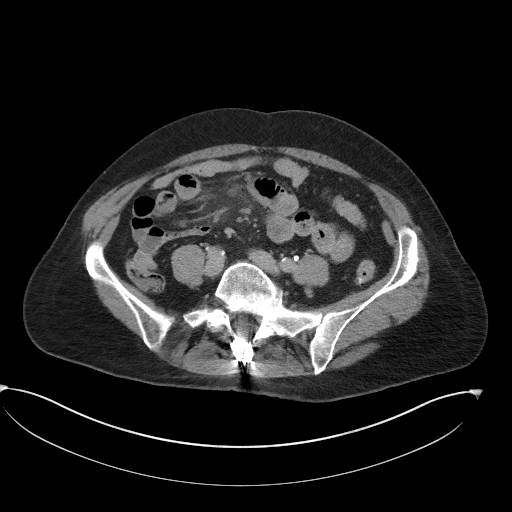
[im 62/104  soft-tissue]
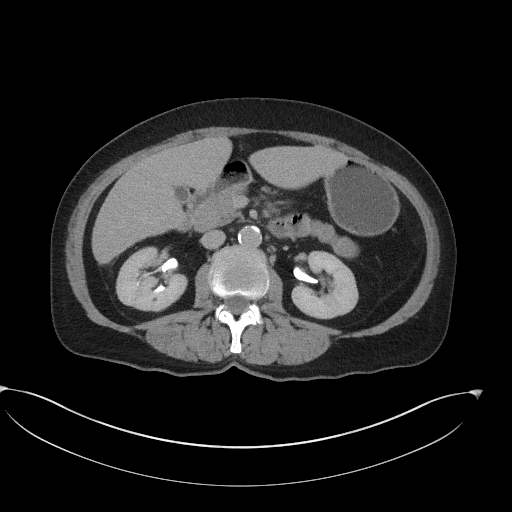
[im 83/104  soft-tissue]
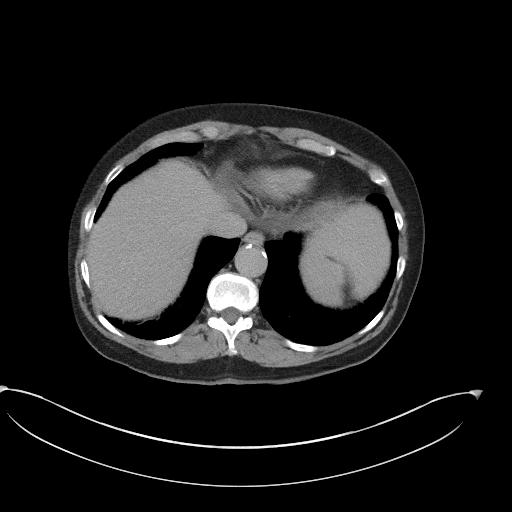

[10 of 46 positions shown; findings below may reference images not displayed]

FINDINGS: Lower chest: Stable mild scarring at both lung bases and a
subpleural bulla in the right lower lobe. No significant pleural or
pericardial effusion.

Hepatobiliary: The liver is normal in density without suspicious
focal abnormality. The gallbladder is incompletely distended. No
evidence of gallstones, gallbladder wall thickening or biliary
dilatation.

Pancreas: Unremarkable. No pancreatic ductal dilatation or
surrounding inflammatory changes.

Spleen: Normal in size without focal abnormality.

Adrenals/Urinary Tract: Both adrenal glands appear normal. Pre
contrast images demonstrate punctate calcifications within the lower
pole calyces of the left kidney (image 41/2). No other renal,
ureteral or bladder calculi are identified. Post contrast, both
kidneys enhance normally. There are tiny low-density renal lesions
bilaterally, too small to characterize, but likely cysts. Delayed
post-contrast images demonstrate segmental visualization of the
ureters. There is no definite abnormal enhancement or filling defect
associated with the small calcifications in the lower pole calyx of
the left kidney. The ureters and bladder appear unremarkable.

Stomach/Bowel: No enteric contrast was administered. The stomach
appears unremarkable for its degree of distension. No evidence of
bowel wall thickening, distention or surrounding inflammatory
change. The appendix is not visualized.

Vascular/Lymphatic: There are multiple newly enlarged lymph nodes
within the small bowel mesentery, largest measuring 2.1 x 1.2 cm on
image 53/7 and 2.5 x 1.5 cm on image 59/7. There is associated soft
tissue stranding in the surrounding mesenteric fat (misty
mesentery). No enlarged retroperitoneal, pelvic or inguinal lymph
nodes. Aortic and branch vessel atherosclerosis without evidence of
aneurysm or large vessel occlusion. The portal, superior mesenteric
and splenic veins are patent.

Reproductive: Hysterectomy.  No adnexal mass.

Other: No evidence of abdominal wall mass or hernia. No ascites.

Musculoskeletal: No acute or significant osseous findings. Status
post interbody and inter spinous fusion at L5-S1.
IMPRESSION: 1. Tiny calcifications in the lower pole calyx of the left kidney,
likely small nonobstructing calculi. No evidence of ureteral
calculus or hydronephrosis.
2. No definite urothelial lesion or suspicious renal mass. If the
patient has persistent unexplained hematuria, further evaluation of
the bladder may be warranted.
3. Multiple new mildly enlarged mesenteric lymph nodes with
associated surrounding mesenteric soft tissue stranding. These
findings are nonspecific and could reflect mesenteric inflammation,
lymphoproliferative disorder or metastatic disease. No primary
malignancy or bowel obstruction identified. If clinical history or
laboratory analysis suggests underlying malignancy, tissue sampling
recommended. Otherwise, recommend 3 month follow-up CT. This
recommendation follows ACR consensus guidelines: White Paper of the
ACR Incidental Findings Committee II on Splenic and Nodal Findings.
[HOSPITAL] 7502;[DATE].
4.  Aortic Atherosclerosis ([id number]-0Q4.4).
5. These results will be called to the ordering clinician or
representative by the Radiologist Assistant, and communication
documented in the PACS or [REDACTED].

## 2023-03-26 DIAGNOSIS — H8110 Benign paroxysmal vertigo, unspecified ear: Secondary | ICD-10-CM | POA: Diagnosis not present

## 2023-03-26 DIAGNOSIS — R42 Dizziness and giddiness: Secondary | ICD-10-CM | POA: Diagnosis not present

## 2023-04-10 DIAGNOSIS — E1143 Type 2 diabetes mellitus with diabetic autonomic (poly)neuropathy: Secondary | ICD-10-CM | POA: Diagnosis not present

## 2023-04-10 DIAGNOSIS — F330 Major depressive disorder, recurrent, mild: Secondary | ICD-10-CM | POA: Diagnosis not present

## 2023-04-10 DIAGNOSIS — I10 Essential (primary) hypertension: Secondary | ICD-10-CM | POA: Diagnosis not present

## 2023-04-10 DIAGNOSIS — M797 Fibromyalgia: Secondary | ICD-10-CM | POA: Diagnosis not present

## 2023-04-24 DIAGNOSIS — I10 Essential (primary) hypertension: Secondary | ICD-10-CM | POA: Diagnosis not present

## 2023-04-24 DIAGNOSIS — Z1331 Encounter for screening for depression: Secondary | ICD-10-CM | POA: Diagnosis not present

## 2023-04-24 DIAGNOSIS — Z0000 Encounter for general adult medical examination without abnormal findings: Secondary | ICD-10-CM | POA: Diagnosis not present

## 2023-04-24 DIAGNOSIS — E1143 Type 2 diabetes mellitus with diabetic autonomic (poly)neuropathy: Secondary | ICD-10-CM | POA: Diagnosis not present

## 2023-04-24 DIAGNOSIS — F330 Major depressive disorder, recurrent, mild: Secondary | ICD-10-CM | POA: Diagnosis not present

## 2023-04-24 DIAGNOSIS — M797 Fibromyalgia: Secondary | ICD-10-CM | POA: Diagnosis not present

## 2023-04-29 DIAGNOSIS — Z1231 Encounter for screening mammogram for malignant neoplasm of breast: Secondary | ICD-10-CM | POA: Diagnosis not present

## 2023-05-03 DIAGNOSIS — H26491 Other secondary cataract, right eye: Secondary | ICD-10-CM | POA: Diagnosis not present

## 2023-05-03 DIAGNOSIS — T8522XA Displacement of intraocular lens, initial encounter: Secondary | ICD-10-CM | POA: Diagnosis not present

## 2023-05-03 DIAGNOSIS — H401134 Primary open-angle glaucoma, bilateral, indeterminate stage: Secondary | ICD-10-CM | POA: Diagnosis not present

## 2023-05-13 DIAGNOSIS — H35371 Puckering of macula, right eye: Secondary | ICD-10-CM | POA: Diagnosis not present

## 2023-05-13 DIAGNOSIS — H35361 Drusen (degenerative) of macula, right eye: Secondary | ICD-10-CM | POA: Diagnosis not present

## 2023-05-13 DIAGNOSIS — T8522XA Displacement of intraocular lens, initial encounter: Secondary | ICD-10-CM | POA: Diagnosis not present

## 2023-05-13 DIAGNOSIS — Z8669 Personal history of other diseases of the nervous system and sense organs: Secondary | ICD-10-CM | POA: Diagnosis not present

## 2023-05-13 DIAGNOSIS — H4312 Vitreous hemorrhage, left eye: Secondary | ICD-10-CM | POA: Diagnosis not present

## 2023-05-22 DIAGNOSIS — I10 Essential (primary) hypertension: Secondary | ICD-10-CM | POA: Diagnosis not present

## 2023-05-22 DIAGNOSIS — F330 Major depressive disorder, recurrent, mild: Secondary | ICD-10-CM | POA: Diagnosis not present

## 2023-05-22 DIAGNOSIS — E1143 Type 2 diabetes mellitus with diabetic autonomic (poly)neuropathy: Secondary | ICD-10-CM | POA: Diagnosis not present

## 2023-06-06 DIAGNOSIS — H27112 Subluxation of lens, left eye: Secondary | ICD-10-CM | POA: Diagnosis not present

## 2023-06-11 DIAGNOSIS — T8522XA Displacement of intraocular lens, initial encounter: Secondary | ICD-10-CM | POA: Diagnosis not present

## 2023-06-11 DIAGNOSIS — H4311 Vitreous hemorrhage, right eye: Secondary | ICD-10-CM | POA: Diagnosis not present

## 2023-06-11 DIAGNOSIS — H43392 Other vitreous opacities, left eye: Secondary | ICD-10-CM | POA: Diagnosis not present

## 2023-06-11 DIAGNOSIS — H44732 Retained (nonmagnetic) (old) foreign body in lens, left eye: Secondary | ICD-10-CM | POA: Diagnosis not present

## 2023-07-31 DIAGNOSIS — F330 Major depressive disorder, recurrent, mild: Secondary | ICD-10-CM | POA: Diagnosis not present

## 2023-07-31 DIAGNOSIS — M797 Fibromyalgia: Secondary | ICD-10-CM | POA: Diagnosis not present

## 2023-07-31 DIAGNOSIS — E1143 Type 2 diabetes mellitus with diabetic autonomic (poly)neuropathy: Secondary | ICD-10-CM | POA: Diagnosis not present

## 2023-07-31 DIAGNOSIS — H6243 Otitis externa in other diseases classified elsewhere, bilateral: Secondary | ICD-10-CM | POA: Diagnosis not present

## 2023-07-31 DIAGNOSIS — I10 Essential (primary) hypertension: Secondary | ICD-10-CM | POA: Diagnosis not present

## 2023-12-16 DIAGNOSIS — R5383 Other fatigue: Secondary | ICD-10-CM | POA: Diagnosis not present

## 2023-12-16 DIAGNOSIS — F330 Major depressive disorder, recurrent, mild: Secondary | ICD-10-CM | POA: Diagnosis not present

## 2023-12-16 DIAGNOSIS — M797 Fibromyalgia: Secondary | ICD-10-CM | POA: Diagnosis not present

## 2023-12-16 DIAGNOSIS — I10 Essential (primary) hypertension: Secondary | ICD-10-CM | POA: Diagnosis not present

## 2023-12-16 DIAGNOSIS — H6243 Otitis externa in other diseases classified elsewhere, bilateral: Secondary | ICD-10-CM | POA: Diagnosis not present

## 2023-12-16 DIAGNOSIS — H6090 Unspecified otitis externa, unspecified ear: Secondary | ICD-10-CM | POA: Diagnosis not present

## 2023-12-16 DIAGNOSIS — E1143 Type 2 diabetes mellitus with diabetic autonomic (poly)neuropathy: Secondary | ICD-10-CM | POA: Diagnosis not present

## 2024-01-24 DIAGNOSIS — R0789 Other chest pain: Secondary | ICD-10-CM | POA: Diagnosis not present

## 2024-01-30 DIAGNOSIS — I10 Essential (primary) hypertension: Secondary | ICD-10-CM | POA: Diagnosis not present

## 2024-01-30 DIAGNOSIS — E1143 Type 2 diabetes mellitus with diabetic autonomic (poly)neuropathy: Secondary | ICD-10-CM | POA: Diagnosis not present

## 2024-03-03 DIAGNOSIS — Z85828 Personal history of other malignant neoplasm of skin: Secondary | ICD-10-CM | POA: Diagnosis not present

## 2024-03-03 DIAGNOSIS — B009 Herpesviral infection, unspecified: Secondary | ICD-10-CM | POA: Diagnosis not present

## 2024-03-03 DIAGNOSIS — L570 Actinic keratosis: Secondary | ICD-10-CM | POA: Diagnosis not present

## 2024-03-03 DIAGNOSIS — L821 Other seborrheic keratosis: Secondary | ICD-10-CM | POA: Diagnosis not present

## 2024-03-25 DIAGNOSIS — G4489 Other headache syndrome: Secondary | ICD-10-CM | POA: Diagnosis not present

## 2024-03-25 DIAGNOSIS — F330 Major depressive disorder, recurrent, mild: Secondary | ICD-10-CM | POA: Diagnosis not present

## 2024-03-25 DIAGNOSIS — E1143 Type 2 diabetes mellitus with diabetic autonomic (poly)neuropathy: Secondary | ICD-10-CM | POA: Diagnosis not present

## 2024-03-25 DIAGNOSIS — I10 Essential (primary) hypertension: Secondary | ICD-10-CM | POA: Diagnosis not present

## 2024-03-25 DIAGNOSIS — M797 Fibromyalgia: Secondary | ICD-10-CM | POA: Diagnosis not present
